# Patient Record
Sex: Male | Born: 1950 | Race: White | Hispanic: No | Marital: Married | Smoking: Never smoker
Health system: Southern US, Community
[De-identification: ages and names within clinical notes are randomized; demographics above are authoritative.]

## PROBLEM LIST (undated history)

## (undated) DIAGNOSIS — E781 Pure hyperglyceridemia: Secondary | ICD-10-CM

## (undated) DIAGNOSIS — K219 Gastro-esophageal reflux disease without esophagitis: Secondary | ICD-10-CM

## (undated) DIAGNOSIS — E291 Testicular hypofunction: Secondary | ICD-10-CM

## (undated) DIAGNOSIS — Z86010 Personal history of colonic polyps: Secondary | ICD-10-CM

## (undated) DIAGNOSIS — M5432 Sciatica, left side: Secondary | ICD-10-CM

## (undated) DIAGNOSIS — I639 Cerebral infarction, unspecified: Secondary | ICD-10-CM

## (undated) DIAGNOSIS — N183 Chronic kidney disease, stage 3 (moderate): Secondary | ICD-10-CM

## (undated) DIAGNOSIS — N400 Enlarged prostate without lower urinary tract symptoms: Secondary | ICD-10-CM

## (undated) DIAGNOSIS — M25552 Pain in left hip: Secondary | ICD-10-CM

## (undated) DIAGNOSIS — Z87438 Personal history of other diseases of male genital organs: Secondary | ICD-10-CM

## (undated) DIAGNOSIS — E1165 Type 2 diabetes mellitus with hyperglycemia: Secondary | ICD-10-CM

## (undated) DIAGNOSIS — I517 Cardiomegaly: Secondary | ICD-10-CM

## (undated) DIAGNOSIS — E782 Mixed hyperlipidemia: Secondary | ICD-10-CM

## (undated) DIAGNOSIS — IMO0002 Reserved for concepts with insufficient information to code with codable children: Secondary | ICD-10-CM

## (undated) DIAGNOSIS — I2510 Atherosclerotic heart disease of native coronary artery without angina pectoris: Secondary | ICD-10-CM

## (undated) DIAGNOSIS — E118 Type 2 diabetes mellitus with unspecified complications: Secondary | ICD-10-CM

## (undated) DIAGNOSIS — I10 Essential (primary) hypertension: Secondary | ICD-10-CM

## (undated) HISTORY — DX: Benign prostatic hyperplasia without lower urinary tract symptoms: N40.0

## (undated) HISTORY — DX: Personal history of other diseases of male genital organs: Z87.438

## (undated) HISTORY — DX: Type 2 diabetes mellitus with unspecified complications: E11.8

## (undated) HISTORY — DX: Pain in left hip: M25.552

## (undated) HISTORY — DX: Pure hyperglyceridemia: E78.1

## (undated) HISTORY — DX: Essential (primary) hypertension: I10

## (undated) HISTORY — DX: Reserved for concepts with insufficient information to code with codable children: IMO0002

## (undated) HISTORY — DX: Cardiomegaly: I51.7

## (undated) HISTORY — DX: Personal history of colonic polyps: Z86.010

## (undated) HISTORY — DX: Cerebral infarction, unspecified: I63.9

## (undated) HISTORY — DX: Mixed hyperlipidemia: E78.2

## (undated) HISTORY — DX: Type 2 diabetes mellitus with hyperglycemia: E11.65

## (undated) HISTORY — DX: Atherosclerotic heart disease of native coronary artery without angina pectoris: I25.10

## (undated) HISTORY — DX: Gastro-esophageal reflux disease without esophagitis: K21.9

## (undated) HISTORY — DX: Sciatica, left side: M54.32

## (undated) HISTORY — DX: Chronic kidney disease, stage 3 (moderate): N18.3

## (undated) HISTORY — DX: Testicular hypofunction: E29.1

## (undated) HISTORY — PX: PROSTATE SURGERY: SHX751

---

## 1999-05-01 ENCOUNTER — Encounter: Admission: RE | Admit: 1999-05-01 | Discharge: 1999-05-01 | Payer: Self-pay | Admitting: Gastroenterology

## 1999-05-01 ENCOUNTER — Encounter: Payer: Self-pay | Admitting: Gastroenterology

## 1999-10-04 ENCOUNTER — Encounter: Payer: Self-pay | Admitting: Orthopedic Surgery

## 1999-10-04 ENCOUNTER — Ambulatory Visit (HOSPITAL_COMMUNITY): Admission: RE | Admit: 1999-10-04 | Discharge: 1999-10-04 | Payer: Self-pay | Admitting: Orthopedic Surgery

## 2002-07-14 ENCOUNTER — Ambulatory Visit (HOSPITAL_COMMUNITY): Admission: RE | Admit: 2002-07-14 | Discharge: 2002-07-14 | Payer: Self-pay | Admitting: Gastroenterology

## 2003-04-27 HISTORY — PX: CORONARY ARTERY BYPASS GRAFT: SHX141

## 2004-04-29 ENCOUNTER — Emergency Department (HOSPITAL_COMMUNITY): Admission: EM | Admit: 2004-04-29 | Discharge: 2004-04-29 | Payer: Self-pay | Admitting: Emergency Medicine

## 2006-02-26 HISTORY — PX: TRANSTHORACIC ECHOCARDIOGRAM: SHX275

## 2006-04-19 ENCOUNTER — Inpatient Hospital Stay (HOSPITAL_BASED_OUTPATIENT_CLINIC_OR_DEPARTMENT_OTHER): Admission: RE | Admit: 2006-04-19 | Discharge: 2006-04-19 | Payer: Self-pay | Admitting: Cardiology

## 2006-04-19 HISTORY — PX: CARDIAC CATHETERIZATION: SHX172

## 2006-04-25 ENCOUNTER — Ambulatory Visit: Payer: Self-pay | Admitting: Cardiothoracic Surgery

## 2006-04-30 ENCOUNTER — Inpatient Hospital Stay (HOSPITAL_COMMUNITY): Admission: RE | Admit: 2006-04-30 | Discharge: 2006-05-06 | Payer: Self-pay | Admitting: Cardiothoracic Surgery

## 2006-04-30 ENCOUNTER — Ambulatory Visit: Payer: Self-pay | Admitting: Cardiothoracic Surgery

## 2006-05-21 ENCOUNTER — Ambulatory Visit: Payer: Self-pay | Admitting: Cardiothoracic Surgery

## 2006-05-23 ENCOUNTER — Encounter (HOSPITAL_COMMUNITY): Admission: RE | Admit: 2006-05-23 | Discharge: 2006-08-21 | Payer: Self-pay | Admitting: Cardiology

## 2006-08-22 ENCOUNTER — Encounter (HOSPITAL_COMMUNITY): Admission: RE | Admit: 2006-08-22 | Discharge: 2006-09-26 | Payer: Self-pay | Admitting: Cardiology

## 2007-04-07 ENCOUNTER — Encounter: Admission: RE | Admit: 2007-04-07 | Discharge: 2007-04-07 | Payer: Self-pay | Admitting: Orthopedic Surgery

## 2008-05-20 ENCOUNTER — Encounter: Admission: RE | Admit: 2008-05-20 | Discharge: 2008-05-20 | Payer: Self-pay | Admitting: Orthopedic Surgery

## 2008-05-28 ENCOUNTER — Encounter: Admission: RE | Admit: 2008-05-28 | Discharge: 2008-05-28 | Payer: Self-pay | Admitting: Orthopedic Surgery

## 2008-06-23 ENCOUNTER — Ambulatory Visit: Payer: Self-pay | Admitting: Cardiothoracic Surgery

## 2009-02-03 ENCOUNTER — Encounter
Admission: RE | Admit: 2009-02-03 | Discharge: 2009-02-03 | Payer: Self-pay | Admitting: Physical Medicine and Rehabilitation

## 2009-06-07 ENCOUNTER — Encounter: Admission: RE | Admit: 2009-06-07 | Discharge: 2009-06-07 | Payer: Self-pay | Admitting: Specialist

## 2009-06-26 HISTORY — PX: LAMINECTOMY: SHX219

## 2009-07-13 ENCOUNTER — Ambulatory Visit (HOSPITAL_COMMUNITY): Admission: RE | Admit: 2009-07-13 | Discharge: 2009-07-15 | Payer: Self-pay | Admitting: Specialist

## 2009-12-27 ENCOUNTER — Ambulatory Visit: Payer: Self-pay | Admitting: Cardiology

## 2010-02-26 DIAGNOSIS — I639 Cerebral infarction, unspecified: Secondary | ICD-10-CM

## 2010-02-26 DIAGNOSIS — M5432 Sciatica, left side: Secondary | ICD-10-CM

## 2010-02-26 DIAGNOSIS — M25552 Pain in left hip: Secondary | ICD-10-CM

## 2010-02-26 HISTORY — DX: Cerebral infarction, unspecified: I63.9

## 2010-02-26 HISTORY — DX: Sciatica, left side: M54.32

## 2010-02-26 HISTORY — DX: Pain in left hip: M25.552

## 2010-03-19 ENCOUNTER — Encounter: Payer: Self-pay | Admitting: Cardiology

## 2010-05-15 LAB — HEMOGLOBIN A1C: Hgb A1c MFr Bld: 9 % — ABNORMAL HIGH (ref <5.7)

## 2010-05-15 LAB — GLUCOSE, CAPILLARY
Glucose-Capillary: 120 mg/dL — ABNORMAL HIGH (ref 70–99)
Glucose-Capillary: 122 mg/dL — ABNORMAL HIGH (ref 70–99)
Glucose-Capillary: 131 mg/dL — ABNORMAL HIGH (ref 70–99)
Glucose-Capillary: 162 mg/dL — ABNORMAL HIGH (ref 70–99)
Glucose-Capillary: 183 mg/dL — ABNORMAL HIGH (ref 70–99)

## 2010-05-16 LAB — URINALYSIS, ROUTINE W REFLEX MICROSCOPIC
Glucose, UA: >1000 mg/dL — AB
Leukocytes, UA: NEGATIVE
Nitrite: NEGATIVE
Specific Gravity, Urine: 1.029 (ref 1.005–1.030)
pH: 6 (ref 5.0–8.0)

## 2010-05-16 LAB — COMPREHENSIVE METABOLIC PANEL
AST: 33 U/L (ref 0–37)
Albumin: 4.2 g/dL (ref 3.5–5.2)
Alkaline Phosphatase: 68 U/L (ref 39–117)
BUN: 17 mg/dL (ref 6–23)
Creatinine, Ser: 1.03 mg/dL (ref 0.4–1.5)
GFR calc Af Amer: >60 mL/min (ref >60)
Potassium: 3.8 mEq/L (ref 3.5–5.1)
Total Protein: 7.7 g/dL (ref 6.0–8.3)

## 2010-05-16 LAB — PROTIME-INR: INR: 0.98 (ref 0.00–1.49)

## 2010-05-16 LAB — CBC
HCT: 42.8 % (ref 39.0–52.0)
Platelets: 166 10*3/uL (ref 150–400)
RDW: 13 % (ref 11.5–15.5)

## 2010-05-16 LAB — APTT: aPTT: 29 seconds (ref 24–37)

## 2010-05-16 LAB — URINE MICROSCOPIC-ADD ON

## 2010-06-27 ENCOUNTER — Other Ambulatory Visit: Payer: Self-pay | Admitting: *Deleted

## 2010-06-27 DIAGNOSIS — E7800 Pure hypercholesterolemia, unspecified: Secondary | ICD-10-CM

## 2010-06-30 ENCOUNTER — Other Ambulatory Visit: Payer: Self-pay | Admitting: Cardiology

## 2010-06-30 MED ORDER — METOPROLOL TARTRATE 50 MG PO TABS: 50.0000 mg | ORAL_TABLET | Freq: Two times a day (BID) | ORAL | Status: DC

## 2010-06-30 NOTE — Telephone Encounter (Signed)
escribe medication per fax request  

## 2010-07-14 NOTE — Consult Note (Signed)
Tommy Morse, DEMASI NO.:  [id number]   MEDICAL RECORD NO.:  [id number]          PATIENT TYPE:  INP   LOCATION:  2031                         FACILITY:  [facility]   PHYSICIAN:  Sheliah Plane, MD    DATE OF BIRTH:  04/26/1950   DATE OF CONSULTATION:  05/21/2006  DATE OF DISCHARGE:  05/06/2006                                 CONSULTATION   OFFICE:  Seen in [place] office.   Patient was seen today in the office in followup after his recent  coronary artery bypass grafting, done on April 30, 2006.  At that time,  he had coronary artery bypass grafting x6 with the left internal mammary  artery to the LAD, reverse saphenous vein graft to the diagonal,  sequential reverse saphenous vein graft to the second and third obtuse  marginal, sequential reverse saphenous vein graft to the posterior  descending and continuation branch of the right coronary artery on April 30, 2006.  The patient has been doing well since discharge, increasing  his physical activity.  Appropriately, he has had no angina or evidence  of congestive heart failure.  He has had dysesthesias over the left  anterior chest associated with the use of the mammary artery.  He denies  any pedal edema.  Chest x-ray was done in Dr. Ronnald Nian office, raised a  question of some cardiac silhouette enlargement, and electrocardiogram  was done in followup to make sure there was no evidence of pleural  effusion.  The patient had moderate LVH with diastolic dysfunction, mild  mitral regurgitation, mild aortic sclerosis and global systolic function  was normal.  No significant pericardial effusion was appreciated.   PHYSICAL EXAMINATION:  VITAL SIGNS:  Patient's blood pressure was  110/70, pulse 68 and regular, respiratory rate is 18.  GENERAL:  Overall, patient looks and feels well.  LUNGS:  Clear bilaterally.  His sternum is stable and well healed.  His  right endo-veinn harvest sites are also healing well.  He has  no pedal  edema.   FOLLOWUP:  Chest x-ray done in Dr. Ronnald Nian office showed no pleural  effusion, question of borderline increasing cardiac silhouette.   MEDICATIONS:  The patient continues on fish oil, aspirin, Toprol, Altace  10 mg, and amiodarone 200 mg p.o. b.i.d. for brief atrial fibrillation  postoperatively.   I reviewed with him that he could return to driving, starting the  cardiac rehab program.  He should avoid any lifting for 25 pounds for  two to three months.  He plans to return to part-time work approximately  6 weeks post-op and gradually increase to full duties in 8 weeks post-  op.  He is to see Dr. Deborah Chalk in the near future.  In the past, the  patient has been intolerant of Statins.  In addition, I discussed with  him probably some time in next month stopping his amiodarone.  The  patient has had urinary tract issues in the past.  At the time of  surgery, he had some difficulty  in passing a Foley, because of urethral strictures.  A small size Foley  was passed.  The patient does continue to have urinary frequency.  I  have encouraged him to seek a follow-up visit with his established  urologist, to ensure that his bladder is emptying properly.      Sheliah Plane, MD  Electronically Signed     EG/MEDQ  D:  05/21/2006  T:  05/21/2006  Job:  [id number]   cc:   Colleen Can. Deborah Chalk, M.D.  Felipa Evener, M.D.

## 2010-07-14 NOTE — Op Note (Signed)
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   OPERATION PERFORMED:  Coronary artery bypass grafting times six with the  left internal mammary artery to left anterior descending coronary  artery, reversed saphenous vein graft to the diagonal coronary artery,  sequential reversed saphenous vein to obtuse marginal 2 and 3,  sequential reversed saphenous vein graft to the posterior descending and  continuation branch of the right coronary artery.   SURGEON:  Gwenith Daily. Tyrone Sage, M.D.   ASSISTANT:  Constance Holster, PA   ANESTHESIA:   INDICATIONS FOR PROCEDURE:  The patient is a 60 year old male with  strong family history of coronary disease who presented with increasing  symptoms of shortness of breath with exertion, underwent echo stress  test and was found to have significant ventricular hypertrophy and  exercise results consistent with coronary occlusive disease.  Coronary  artery angiography was performed by Colleen Can. Deborah Chalk, M.D., which  demonstrated severe three-vessel coronary artery disease with 70 to 80%  stenosis of the LAD and diagonal, 80% proximal circumflex with  sequential lesions involving two obtuse marginal branches, sequential 70  and 80 to 90% stenoses of the right coronary artery with subtotal  occlusion of the continuation branch of the right coronary artery and  high take off of the posterior descending.  Overall ventricular function  was preserved.  Because of the patient's  significant three-vessel  disease, coronary artery bypass grafting was recommended.  The patient  agreed and signed informed consent.   DESCRIPTION OF PROCEDURE:  With Swann-Ganz and arterial line monitors in  place, the patient underwent general endotracheal anesthesia without  incident.  The skin of the chest and legs was prepped with Betadine and  draped in the usual sterile manner.  Using the [employer] Vein  harvesting system, vein was harvested from the right thigh and calf and  was of excellent quality and caliber.  A median sternotomy was  performed.  The left internal mammary artery was dissected down as a  pedicle graft.  The distal artery was divided and had good free flow.  The pericardium was opened.  Overall ventricular function appeared  preserved.  The patient was systemically heparinized.  The ascending  aorta and the right atrium were cannulated in the aortic root.  A bent  cardioplegia needle was introduced into the ascending aorta.  The  patient was placed on cardiopulmonary bypass at 2.4L per minute per  meter squared.  Sites for anastomosis were selected and dissected out of  the epicardium.  The patient's body temperature was cooled to 30  degrees.  An aortic crossclamp was applied.  500 mL of cold blood  potassium cardioplegia was administered with rapid diastolic arrest of  the heart.  Myocardial septal temperature was monitored throughout the  crossclamp period.   Attention was turned first to the two obtuse marginal branches, each of  which was opened, was diffusely diseased but admitted a 1.5 mm probe  distally.  Using a diamond type side-to-side anastomosis was carried out  with second obtuse marginal.  The distal extend of the same vein was  then carried a short distance to the third obtuse marginal and a similar  fashion, a distal anastomosis was performed.  Attention was then turned  to the posterior descending coronary artery which arose off the  right  coronary artery more proximal than usual.  The continuation branch of  the right coronary artery was identified and was diffusely diseased but  did admit a 1 mm probe.  A diamond type side-to-side anastomosis was  carried out with the posterior descending coronary artery and the distal  extent of the same vein was then carried to the continuation branch of  the right coronary artery which was opened.  Each anastomosis was  carried out with 7-0 Prolenes.  Attention was then turned to the left  anterior descending coronary artery which was relatively small vessel,  not much larger than the diagonal.  Using the running 8-0 Prolene, the  left internal mammary artery was anastomosed to the left anterior  descending coronary artery.  The bulldog was removed from the mammary  artery and there was rise in myocardial septal temperature.  The aortic  crossclamp was removed.  The patient spontaneously converted to a sinus  rhythm, rewarmed to 37 degrees.  He was then ventilated and weaned from  cardiopulmonary bypass ion milrinone infusion.  He was decannulated in  the usual fashion.  Protamine sulfate was administered.  With the  operative field hemostatic, two atrial and two ventricular pacing wires  were applied.  Graft markers were applied.  A left pleural tube and a  __________ mediastinal drain were left in place.  Sternum was closed  with #6 stainless steel wire.  Fascia closed with interrupted 0 Vicryl,  running 3-0 Vicryl in the subcutaneous tissues and 4-0 subcuticular  stitch in the skin edges.  Dry dressings were applied.  Sponge and  needle counts were reported as correct at the completion of the  procedure.  The patient tolerated the procedure without obvious  complication and was transferred to the surgical intensive care unit for  further postoperative care.      Sheliah Plane, MD  Electronically Signed    EG/MEDQ  D:  05/03/2006  T:  05/03/2006  Job:  [id number]   cc:    Colleen Can. Deborah Chalk, M.D.

## 2010-07-14 NOTE — Discharge Summary (Signed)
Tommy Morse, Tommy Morse NO.:  [id number]   MEDICAL RECORD NO.:  [id number]          PATIENT TYPE:  INP   LOCATION:  2031                         FACILITY:  [facility]   PHYSICIAN:  Sheliah Plane, MD    DATE OF BIRTH:  07-02-1950   DATE OF ADMISSION:  04/30/2006  DATE OF DISCHARGE:  05/06/2006                               DISCHARGE SUMMARY   The patient is a 60 year old male referred to Dr. Tyrone Sage for possible  coronary artery bypass grafting.   HISTORY OF PRESENT ILLNESS:  The patient is a 60 year old male with a  strong family history of coronary occlusive disease who over the past  three months had noticed increasing fatigue and shortness of breath with  exertion, especially when climbing stairs.  He denied any paroxysmal  nocturnal dyspnea or edema.  He denied chest pain.  He had no previous  history of myocardial infarction.  A stress echocardiogram was done and  this was positive for ischemia.  The echocardiogram also showed  associated marked left ventricular hypertrophy.  The patient then  underwent cardiac catheterization by Dr. Deborah Chalk on April 19, 2006.  This study revealed a 20% distal narrowing of the left main.  There was  a 30 to 50% ostial narrowing of the circumflex.  There is a 90% stenosis  in the main portion of the circumflex just prior to the bifurcating  marginal vessels each also having 70 to 80% lesions.  The LAD was also  found to have an ostial narrowing.  In addition, there was a 60% mid-  vessel narrowing and an 80 to 90% distal vessel narrowing.  The right  coronary artery had a 95% diffuse narrowing in the proximal portion.  There were noted to be left-to-right collaterals present with an  ejection fraction of 60 to 65%.  Because of the diffuse nature of  disease, coronary artery bypass grafting was recommended to the patient  and the patient was admitted this hospitalization for the procedure.   PAST MEDICAL HISTORY:  Includes  hypertension and hyperlipidemia which he  has been treated with various satins.  HE IS UNABLE TO TOLERATE THESE.  He denied diabetes, denied smoking, denied previous stroke, denied  claudication, denied renal insufficiency.  His father had a myocardial  infarction and died at age 81.  Previous to that, his father had had  bypass surgery.  The patient's mother also had coronary artery bypass  grafting as well as critical aortic stenosis.   PAST SURGICAL HISTORY:  Arthroscopy in the left knee, he has also  undergone a previous urological procedure for benign prostatic  hyperplasia.   MEDICATIONS PRIOR TO ADMISSION:  Included:  1. Aspirin 81 mg daily.  2. Altace 10 mg daily.  3. Toprol 100 mg daily.  4. Fish oil capsules four a day.   ALLERGIES:  INCLUDES STATINS AS WELL AS AUGMENTIN.   REVIEW OF SYMPTOMS AND PHYSICAL EXAM:  Please see the history and  physical done at the time of admission.   HOSPITAL COURSE:  The patient was admitted electively and on April 30, 2006, he was  taken to the operating room at which time he underwent the  following procedure:  Coronary artery bypass grafting x6 with the left  internal mammary artery to the left anterior descending coronary artery,  reversed saphenous vein graft to the diagonal coronary artery,  sequential reversed saphenous vein to the obtuse marginal 2 and 3, and  sequential reversed saphenous vein graft to the posterior descending and  continuation branch of the right coronary artery.  Patient tolerated the  procedure well and was taken to the surgical intensive care unit in  stable condition.   POSTOPERATIVE HOSPITAL COURSE:  Patient has done well.  He was extubated  without difficulty.  He has maintained stable hemodynamics.  He  initially did require some inotropic support, this was weaned without  difficulty.  He did have postoperative atrial fibrillation and was  chemically cardioverted with amiodarone back to a normal sinus  rhythm.  He has been quite anxious in the postoperative period, but this is  slowly improving.  His cardiac enzymes were checked postoperatively and  they were unremarkable.  His laboratory values also reveal a moderate  anemia, but his hemoglobin and hematocrit have stabilized.  It was most  recent 10 and 31, respectively, on May 04, 2006.  Patient has had an  elevated glucose in the postoperative period.  Currently, a hemoglobin  A1c is pending.  It is Dr. York Spaniel recommendation that the patient be  assessed as an outpatient for his diabetes and he has not been started  on any oral medication at this point.  His blood sugars are under  reasonable control, but will definitely need to be addressed as an  outpatient.  Currently, the patient is felt to be stable for tentative  discharge in the morning of May 06, 2006 pending morning round  reevaluation.   MEDICATIONS AT TIME OF DISCHARGE:  Will be as follows:  1. Aspirin 325 mg daily.  2. Lopressor 50 mg twice daily.  3. Altace 10 mg daily.  4. Lasix 40 mg daily for five days.  5. Potassium 20 mEq daily for five days.  6. Mucinex 600 mg twice daily as needed.  7. Claritin and Sudafed over-the-counter as needed.  8. Amiodarone 400 mg twice daily x7 days and then once daily for pain.  9. Ultram 50 mg one to two tablets every six hours as needed.   INSTRUCTIONS:  The patient will receive written instructions in regard  to medications, activity, diet, wound care and followup.  Followup  include Dr. Deborah Chalk in two weeks, he is instructed to call for an  appointment.  Additionally, he will see Dr. Tyrone Sage in three weeks, the  office will arrange this.   FINAL DIAGNOSIS:  Severe coronary artery disease as outlined above.   OTHER DIAGNOSES:  Include:  1. Hypertension.  2. Hyperlipidemia.  3. INTOLERANCE TO STATINS.  4. Significant family history of coronary artery disease.  5. Postoperative anemia. 6. Postoperative atrial  fibrillation.  7. History of benign prostatic hyperplasia.      Rowe Clack, P.A.-C.      Sheliah Plane, MD  Electronically Signed    WEG/MEDQ  D:  05/05/2006  T:  05/05/2006  Job:  [id number]   cc:   Avelino Leeds

## 2010-07-14 NOTE — Op Note (Signed)
   NAME:  Tommy Morse, Tommy Morse                        ACCOUNT NO.:  [id number]   MEDICAL RECORD NO.:  [id number]                   PATIENT TYPE:  AMB   LOCATION:  ENDO                                 [facility]ACILITY[facility]  [facility]   PHYSICIAN:  John C. Madilyn Fireman, M.D.                 DATE OF BIRTH:  1950-09-17   DATE OF PROCEDURE:  07/14/2002  DATE OF DISCHARGE:                                 OPERATIVE REPORT   PROCEDURE:  Colonoscopy.   INDICATION FOR PROCEDURE:  Rectal bleeding in a 60 year old patient with no  prior colon screening.   DESCRIPTION OF PROCEDURE:  The patient was placed in the left lateral  decubitus position and placed on the pulse monitor with continuous low-flow  oxygen delivered by nasal cannula.  He was sedated with 125 mcg IV fentanyl  and 10 mg IV Versed.  The Olympus video colonoscope was inserted into the  rectum and advanced to the cecum, confirmed by transillumination of  McBurney's point and visualization of the ileocecal valve and appendiceal  orifice.  The prep was excellent.  The cecum, ascending, transverse,  descending, and sigmoid colon all appeared normal with no masses, polyps,  diverticula, or other mucosal abnormalities.  The rectum likewise appeared  normal, and retroflexed view of the anus revealed only small internal  hemorrhoids.  The colonoscope was then withdrawn and the patient returned to  the recovery room in stable condition.  He tolerated the procedure well, and  there were no immediate complications.   IMPRESSION:  Internal hemorrhoids, otherwise normal colonoscopy.   PLAN:  Repeat next colon screening by sigmoidoscopy in five years.                                               John C. Madilyn Fireman, M.D.    JCH/MEDQ  D:  07/14/2002  T:  07/15/2002  Job:  [id number]   cc:   Colleen Can. Deborah Chalk, M.D.  [address], [address]  [place]  [place] [address]  Fax: [phone]

## 2010-07-14 NOTE — Cardiovascular Report (Signed)
NAMETRADARIUS, REINWALD              ACCOUNT NO.:  [id number]   MEDICAL RECORD NO.:  [id number]          PATIENT TYPE:  OIB   LOCATION:  1961                         FACILITY:  [facility]   PHYSICIAN:  Colleen Can. Deborah Chalk, M.D.DATE OF BIRTH:  04-Apr-1950   DATE OF PROCEDURE:  DATE OF DISCHARGE:                            CARDIAC CATHETERIZATION   ADDENDUM:  Abdominal aortogram was performed which showed no  atherosclerosis and no renal artery stenosis.      Colleen Can. Deborah Chalk, M.D.  Electronically Signed     SNT/MEDQ  D:  04/19/2006  T:  04/20/2006  Job:  [id number]

## 2010-07-14 NOTE — Consult Note (Signed)
NAMEPARISH, DUBOSE NO.:  [id number]   MEDICAL RECORD NO.:  [id number]          PATIENT TYPE:  OIB   LOCATION:  1961                         FACILITY:  [facility]   PHYSICIAN:  Sheliah Plane, MD    DATE OF BIRTH:  October 28, 1950   DATE OF CONSULTATION:  04/25/2005  DATE OF DISCHARGE:  04/19/2006                                 CONSULTATION   PRIMARY CARE PHYSICIAN:  Avelino Leeds, M.D.   REASON FOR CONSULTATION:  Coronary occlusive disease. Referred for  possible coronary artery bypass grafting.   HISTORY OF PRESENT ILLNESS:  The patient is a 60 year old male with a  strong family history of coronary occlusive disease who, over the past 3  months, had noticed increasing fatigue and shortness of breath with  exertion, especially when climbing stairs. He denies any PND. Denies  pedal edema. Denies chest pain. Has had no previous history of  myocardial infarction. Because of the strong family history of coronary  disease, a stress echocardiogram was done and was positive for ischemia,  echocardiographically and electrocardiographically. The echocardiogram  also showed associated marked left ventricular hypertrophy. Because of  these reasons, he underwent cardiac catheterization by Dr. Deborah Chalk on  April 19, 2006. Cardiac risk factors included hypertension and  hyperlipidemia. The patient has had known hyperlipidemia and has been  treated with various statins, most recently treated with Tricor.  However, he has been unable to tolerate Statins. He denies diabetes,  denies smoking, denies previous stroke, denies claudication, denies  renal insufficiency. His father had a myocardial infarction and died at  age 55. Previously had had bypass surgery. The patient's mother died at  age 9 after having coronary artery bypass grafting several years before  and then being diagnosed with critical AS. The patient has 4 brothers  alive. One brother has died. He has 2 sisters.  All of his brothers have  had a history of hypercholesterolemia.   PAST SURGICAL HISTORY:  Had arthroscopy of the left knee. Has had  previous urologic procedure done for hypertrophy.   SOCIAL HISTORY:  The patient is married. Lives with his wife. Denies  alcohol or tobacco use. Has 3 grown children. Currently employed as a  Optician, dispensing.   MEDICATIONS:  Include aspirin 81 mg daily, Altace 10 mg daily, Toprol  100 mg daily, and fish oil 4 capsules a day.   ALLERGIES:  STATINS. History of swelling of his face with AUGMENTIN.   REVIEW OF SYSTEMS:  CARDIAC:  Negative for chest pain, resting shortness  of breath, orthopnea, syncope, pre-syncope, palpitations, lower  extremity edema. He does have exertional shortness of breath. GENERAL:  Denies constitutional symptoms other than his wife notes that he has had  increasing fatigue over the past 3 months. Denies any vision changes.  Denies amaurosis. Denies TIA's. Denies orthopnea. Does have urinary  frequency and urgency. Denies blood in stool or urine.   PHYSICAL EXAMINATION:  VITAL SIGNS:  Blood pressure 148/88, pulse 69,  respiratory rate 18. O2 sat 98%. Height 6 foot 1 inch tall. Weight is  213 pounds.  NEUROLOGIC: Alert. He is able to  relay history without difficulty.  HEENT:  Pupils are equal, round, and reactive to light and  accommodation.  NECK:  Without carotid bruits or jugular venous distention.  LUNGS:  Clear bilaterally.  CARDIAC:  Regular rate and rhythm. Without murmur or gallop.  ABDOMEN:  Examination benign without palpable masses or organomegaly.  The aorta is not palpably enlarged. He appears to have adequate vein for  bypass in both lower extremities. There are +1 and +2 DP and PT pulses  bilaterally. The patient is right handed and has negative Allen's test  bilaterally.   LABORATORY DATA:  Preoperative carotid Doppler's show normal upper  extremity Doppler's. No internal carotid artery stenosis. Cardiac   catheterization is performed by Dr. Deborah Chalk, which shows 20% distal  narrowing of the left main. There is a 30% to 50% ostial narrowing of  the circumflex, 90% stenosis in the main portion of the circumflex, just  prior to the bifurcating marginal vessels, each with 70% to 80%. Both,  just the marginal vessels appear suitable for bypass. The LAD has an  ostial narrowing and a 60% mid narrowing, 80% to 90% distally. The right  coronary artery has 95% diffuse narrowing in the proximal portion. There  are left to right collaterals present with ejection fraction of 60% to  65%. Because of the diffuse nature of the disease, coronary artery  bypass grafting is recommended to the patient, who agrees and signs  informed consent.   DESCRIPTION OF PROCEDURE:  The risks and options are discussed with the  patient in detail including the risk of death, infection, stroke,  myocardial infarction, bleeding, and blood transfusion. The patient is  willing to proceed and tentatively planned for Tuesday, April 30, 2006.      Sheliah Plane, MD  Electronically Signed     EG/MEDQ  D:  04/25/2006  T:  04/25/2006  Job:  [id number]   cc:   Cathlean Cower. Deborah Chalk, M.D.

## 2010-07-14 NOTE — Cardiovascular Report (Signed)
NAMEEESA, JUSTISS              ACCOUNT NO.:  [id number]   MEDICAL RECORD NO.:  [id number]          PATIENT TYPE:  OIB   LOCATION:  1961                         FACILITY:  [facility]   PHYSICIAN:  Colleen Can. Deborah Chalk, M.D.DATE OF BIRTH:  Apr 21, 1950   DATE OF PROCEDURE:  04/19/2006  DATE OF DISCHARGE:                            CARDIAC CATHETERIZATION   PROCEDURE:  Left-and-right heart catheterization with selective coronary  angiography, left ventricular angiography.   Type and site of interpercutaneous right femoral artery.  Catheter is a  4-French four curved Judkins right-and-left coronary catheters, and 4-  French pigtail ventriculographic catheter.   CONTRAST:  Omnipaque.   MEDICATIONS:  Given prior procedure--Valium 10 mg p.o.   MEDICATIONS GIVEN DURING PROCEDURE:  Versed 2 mg IV.   COMMENTS:  The patient tolerated the procedure well.   HEMODYNAMIC DATA:  The aortic pressure was 121/74, LV was 120 over 5-9.  There is no aortic valve gradient noted on pullback.   ANGIOGRAPHIC DATA:  The coronary arteries were calcified proximally.   FINDINGS:  1. Left Main Coronary Artery:  The left main coronary artery had a 20-      30% distal narrowing.  2. Left Circumflex:  The left circumflex continued mainly as a      bifurcating marginal vessel.  There is an ostial 30-50% narrowing.      In the early main portion of the circumflex, there is a 95%      stenosis with diffuse disease otherwise.  At the beginning of the      bifurcation of the marginal vessels, there is a 70-80% narrowing.      The distal marginal vessels would appear to be suitable for bypass      grafting.  3. Left Anterior Descending:  The left anterior descending had      approximately 30% ostial narrowing.  There was 50-60% narrowing in      the midportion of the left anterior descending.  There is a larger      second diagonal vessel.  There is 50% narrowing just after the      origin of the diagonal.  As the  left anterior descending crossed      the apex, there was an 80-90% stenosis very distally.  4. Right Coronary Artery:  The right coronary artery had a 95% diffuse      narrowing in its proximal portion.  It was a somewhat diffuse      section with released sequential lesions.  There was satisfactory      distal vessels for bypass, but there was also left-to-right      collaterals distally present.   LEFT VENTRICULAR ANGIOGRAM:  Performed in an RAO position.  Overall  cardiac size and silhouette were normal.  Global ejection fraction would  be in the 60-65% range.  Regional wall motion was normal.   OVERALL IMPRESSION:  Diffuse severe 3-vessel coronary disease with 95%  right coronary artery, 95% left circumflex, 60% left anterior  descending, but with ostial left circumflex, distal left main, and  ostial LAD disease limiting procedural options.   ADDENDUM:  There was a small high intermediate branch with a 90%  stenosis, but it is too small to consider for bypass grafting.   DISCUSSION:  In light of these findings, will review the scenarios, but  almost certainly will refer the patient for coronary artery bypass  grafting.      Colleen Can. Deborah Chalk, M.D.  Electronically Signed     SNT/MEDQ  D:  04/19/2006  T:  04/19/2006  Job:  [id number]

## 2010-07-14 NOTE — H&P (Signed)
NAMESTANISLAV, Tommy Morse              ACCOUNT NO.:  [id number]   MEDICAL RECORD NO.:  [id number]           PATIENT TYPE:   LOCATION:                                 FACILITY:   PHYSICIAN:  Colleen Can. Deborah Chalk, M.D.DATE OF BIRTH:  August 04, 1950   DATE OF ADMISSION:  04/19/2006  DATE OF DISCHARGE:                              HISTORY & PHYSICAL   CHIEF COMPLAINT:  None.   HISTORY OF PRESENT ILLNESS:  Mr. Scialdone is a very pleasant 60 year old  white male.  He has known hypertensive heart disease and history of  hyperlipidemia.  He has basically been statin intolerant secondary to  myalgias.  He has prominent LVH on echocardiogram.  He had a recent  stress echocardiogram, which was electrocardiographically positive for  ischemia as well as echocardiographically positive for ischemia but  associated with marked LV hypertrophy.  He is now referred for elective  cardiac catheterization.  He has had no complaints of chest pain.   PAST MEDICAL HISTORY.:  1. Hyperlipidemia.  He is intolerant to statins  2. Known LVH  3. Hypertensive heart disease.  4. Positive family history of coronary disease.  5. Remote history of headaches following a basketball injury.   ALLERGIES:  Allergies are AUGMENTIN AND STATINS.   CURRENT MEDICINES:  Include:  1. Aspirin 81 mg a day.  2. Altace 10 mg a day.  3. Toprol XL 25 mg a day.  4. Fish oil (Lovaza) 4 capsules daily.   FAMILY HISTORY:  His father died at 36 of a heart attack and had had  bypass surgery.  Mother died of 91 with heart attack and valvular heart  disease.  He is had one brother who is died secondary to alcohol and  drug abuse.  One brother has had hypertension as well as obesity.  His  uncles died in their 7s and early 58s of coronary disease and all of  them were cigarette smokers.   SOCIAL HISTORY:  He is a Optician, dispensing at [employer]  He does not use alcohol or tobacco.  He lives at home with his  wife and he has  three grown children.   REVIEW OF SYSTEMS:  Is as noted above is otherwise unremarkable.   ON EXAM:  He is a very pleasant middle-aged white male in no acute  distress.  VITAL SIGNS:  His weight is 213 pounds, blood pressure is 138/78  sitting, 130/70 standing, heart rate 72, respirations 18.  He  is  afebrile.  SKIN:  Warm and dry.  Color is unremarkable.  LUNGS:  Clear.  HEART:  Shows regular rhythm.  ABDOMEN:  Soft, positive bowel sounds, nontender.  EXTREMITIES:  Without edema.  NEUROLOGIC:  Shows no gross focal deficits.   Pertinent labs are pending.   OVERALL IMPRESSION:  1. Abnormal stress echocardiogram.  2. Hypertension with associated LVH  3. Strongly positive history of coronary disease.  4. Hyperlipidemia with statin intolerance.   PLAN:  Will proceed on with diagnostic catheterization.  Procedure has  been reviewed in full detail and he is willing to proceed on  Friday,  April 19, 2006.      Sharlee Blew, N.P.      Colleen Can. Deborah Chalk, M.D.  Electronically Signed    LC/MEDQ  D:  04/17/2006  T:  04/17/2006  Job:  [id number]   cc:   Avelino Leeds

## 2010-07-17 ENCOUNTER — Encounter: Payer: Self-pay | Admitting: *Deleted

## 2010-07-17 DIAGNOSIS — I2510 Atherosclerotic heart disease of native coronary artery without angina pectoris: Secondary | ICD-10-CM

## 2010-07-17 DIAGNOSIS — I517 Cardiomegaly: Secondary | ICD-10-CM

## 2010-07-17 DIAGNOSIS — I10 Essential (primary) hypertension: Secondary | ICD-10-CM

## 2010-07-17 DIAGNOSIS — E785 Hyperlipidemia, unspecified: Secondary | ICD-10-CM

## 2010-07-18 ENCOUNTER — Ambulatory Visit (INDEPENDENT_AMBULATORY_CARE_PROVIDER_SITE_OTHER): Payer: BC Managed Care – PPO | Admitting: Cardiology

## 2010-07-18 ENCOUNTER — Other Ambulatory Visit (INDEPENDENT_AMBULATORY_CARE_PROVIDER_SITE_OTHER): Payer: BC Managed Care – PPO | Admitting: Cardiology

## 2010-07-18 ENCOUNTER — Encounter: Payer: Self-pay | Admitting: *Deleted

## 2010-07-18 ENCOUNTER — Other Ambulatory Visit (INDEPENDENT_AMBULATORY_CARE_PROVIDER_SITE_OTHER): Payer: BC Managed Care – PPO | Admitting: *Deleted

## 2010-07-18 ENCOUNTER — Encounter: Payer: Self-pay | Admitting: Cardiology

## 2010-07-18 DIAGNOSIS — I2510 Atherosclerotic heart disease of native coronary artery without angina pectoris: Secondary | ICD-10-CM | POA: Insufficient documentation

## 2010-07-18 DIAGNOSIS — I10 Essential (primary) hypertension: Secondary | ICD-10-CM

## 2010-07-18 DIAGNOSIS — E7800 Pure hypercholesterolemia, unspecified: Secondary | ICD-10-CM

## 2010-07-18 DIAGNOSIS — I4891 Unspecified atrial fibrillation: Secondary | ICD-10-CM

## 2010-07-18 DIAGNOSIS — E785 Hyperlipidemia, unspecified: Secondary | ICD-10-CM

## 2010-07-18 LAB — BASIC METABOLIC PANEL
Chloride: 101 mEq/L (ref 96–112)
Potassium: 3.9 mEq/L (ref 3.5–5.1)

## 2010-07-18 LAB — HEPATIC FUNCTION PANEL: Albumin: 4.1 g/dL (ref 3.5–5.2)

## 2010-07-18 LAB — LIPID PANEL
Cholesterol: 306 mg/dL — ABNORMAL HIGH (ref 0–200)
Total CHOL/HDL Ratio: 7
Triglycerides: 770 mg/dL — ABNORMAL HIGH (ref 0.0–149.0)
VLDL: 154 mg/dL — ABNORMAL HIGH (ref 0.0–40.0)

## 2010-07-18 LAB — LDL CHOLESTEROL, DIRECT: Direct LDL: 154.3 mg/dL

## 2010-07-18 NOTE — Assessment & Plan Note (Signed)
Blood pressure is well controlled on current medications. 

## 2010-07-18 NOTE — Assessment & Plan Note (Signed)
Is currently free of symptoms and had a negative stress Cardiolite study in April of 2011 before his back surgery. His ejection fraction was 64%. The main concern is his inability to take cholesterol lowering medications.

## 2010-07-18 NOTE — Progress Notes (Signed)
Subjective:   Tommy Morse is seen today for a followup visit to in general, he is recovering nicely from his spinal stenosis surgery one year ago. He a coronary artery bypass graft in March 2008 x6 with a left internal mammary artery graft to the LAD, saphenous vein graft to diagonal, saphenous vein graft to second and third obtuse marginals, and a saphenous vein graft to posterior descending and continuation branch of the right coronary artery.  He has hyperlipemia and has been unable to tolerate cholesterol lowering medicines. He's been to lipid clinic in [place] without benefit. His family physician, Dr. Rondall Allegra has also tried numerous meds to lower his cholesterol level and we are left with exercise and diet primarily. Tommy Morse has mild elevation of glucose levels which are generally well-controlled. He has a history of hypertension that is well-controlled.  Current Outpatient Prescriptions  Medication Sig Dispense Refill  . aspirin 325 MG tablet Take 325 mg by mouth daily.        Marland Kitchen glipiZIDE (GLUCOTROL) 5 MG tablet Take 5 mg by mouth 2 (two) times daily before a meal.        . hydrochlorothiazide 25 MG tablet Take 25 mg by mouth daily.        . metFORMIN (GLUCOPHAGE) 500 MG tablet Take 1,000 mg by mouth 2 (two) times daily with a meal.       . metoprolol (LOPRESSOR) 50 MG tablet Take 1 tablet (50 mg total) by mouth 2 (two) times daily.  60 tablet  11  . omega-3 acid ethyl esters (LOVAZA) 1 G capsule Take 2 g by mouth daily.        . ramipril (ALTACE) 10 MG tablet Take 10 mg by mouth daily.          Allergies  Allergen Reactions  . Augmentin   . Statins     Patient Active Problem List  Diagnoses  . Hypertension  . Hyperlipidemia  . LVH (left ventricular hypertrophy)  . Coronary artery disease  . Diabetes mellitus    History  Smoking status  . Never Smoker   Smokeless tobacco  . Never Used    History  Alcohol Use No    Family History  Problem Relation Age of Onset  . Heart  disease Mother     valvular heart disease / Strong Family History  . Heart attack Father   . Heart attack Mother   . Hypertension Brother     Review of Systems:   The patient denies any heat or cold intolerance.  No weight gain or weight loss.  The patient denies headaches or blurry vision.  There is no cough or sputum production.  The patient denies dizziness.  There is no hematuria or hematochezia.  The patient denies any muscle aches or arthritis.  The patient denies any rash.  The patient denies frequent falling or instability.  There is no history of depression or anxiety.  All other systems were reviewed and are negative.   Physical Exam:   Weight is 211. Blood pressure is 130/90. Heart rate is 60.The head is normocephalic and atraumatic.  Pupils are equally round and reactive to light.  Sclerae nonicteric.  Conjunctiva is clear.  Oropharynx is unremarkable.  There's adequate oral airway.  Neck is supple there are no masses.  Thyroid is not enlarged.  There is no lymphadenopathy.  Lungs are clear.  Chest is symmetric.  Heart shows a regular rate and rhythm.  S1 and S2 are normal.  There is  no murmur click or gallop.  Abdomen is soft normal bowel sounds.  There is no organomegaly.  Genital and rectal deferred.  Extremities are without edema.  Peripheral pulses are adequate.  Neurologically intact.  Full range of motion.  The patient is not depressed.  Skin is warm and dry. Assessment / Plan:

## 2010-07-18 NOTE — Assessment & Plan Note (Signed)
He is unable to take statin therapy and is trying to modify his diet.

## 2010-07-19 ENCOUNTER — Other Ambulatory Visit: Payer: Self-pay | Admitting: Cardiology

## 2010-07-20 ENCOUNTER — Other Ambulatory Visit: Payer: Self-pay | Admitting: *Deleted

## 2010-07-20 MED ORDER — METFORMIN HCL 500 MG PO TABS: 1000.0000 mg | ORAL_TABLET | Freq: Two times a day (BID) | ORAL | Status: DC

## 2010-07-20 NOTE — Telephone Encounter (Signed)
[facility]

## 2010-07-26 ENCOUNTER — Telehealth: Payer: Self-pay | Admitting: *Deleted

## 2010-07-26 NOTE — Telephone Encounter (Signed)
Returned phone call about blood work results. Please call back.

## 2010-07-26 NOTE — Telephone Encounter (Signed)
Pt notified of lab results and will continue to work on diet and exercise.

## 2010-07-26 NOTE — Telephone Encounter (Signed)
LMOM to call back regarding lab work 

## 2010-08-22 ENCOUNTER — Other Ambulatory Visit: Payer: Self-pay | Admitting: *Deleted

## 2010-08-22 NOTE — Telephone Encounter (Signed)
RN called in refill for Lopressor 50 mg 1 1/2 tablet po bid #90 with 6 refills to [facility] per pt's request.

## 2010-11-14 ENCOUNTER — Inpatient Hospital Stay (HOSPITAL_COMMUNITY)
Admission: AD | Admit: 2010-11-14 | Discharge: 2010-11-15 | DRG: 014 | Disposition: A | Discharge status: Home / Self Care | Payer: BC Managed Care – PPO | Source: Other Acute Inpatient Hospital | Attending: Internal Medicine | Admitting: Internal Medicine

## 2010-11-14 DIAGNOSIS — Z7902 Long term (current) use of antithrombotics/antiplatelets: Secondary | ICD-10-CM

## 2010-11-14 DIAGNOSIS — I10 Essential (primary) hypertension: Secondary | ICD-10-CM | POA: Diagnosis present

## 2010-11-14 DIAGNOSIS — I2510 Atherosclerotic heart disease of native coronary artery without angina pectoris: Secondary | ICD-10-CM | POA: Diagnosis present

## 2010-11-14 DIAGNOSIS — E785 Hyperlipidemia, unspecified: Secondary | ICD-10-CM | POA: Diagnosis present

## 2010-11-14 DIAGNOSIS — E781 Pure hyperglyceridemia: Secondary | ICD-10-CM | POA: Diagnosis present

## 2010-11-14 DIAGNOSIS — Z951 Presence of aortocoronary bypass graft: Secondary | ICD-10-CM

## 2010-11-14 DIAGNOSIS — Z7982 Long term (current) use of aspirin: Secondary | ICD-10-CM

## 2010-11-14 DIAGNOSIS — E119 Type 2 diabetes mellitus without complications: Secondary | ICD-10-CM | POA: Diagnosis present

## 2010-11-14 DIAGNOSIS — I6350 Cerebral infarction due to unspecified occlusion or stenosis of unspecified cerebral artery: Principal | ICD-10-CM | POA: Diagnosis present

## 2010-11-14 DIAGNOSIS — Z79899 Other long term (current) drug therapy: Secondary | ICD-10-CM

## 2010-11-14 LAB — CBC
MCH: 31.2 pg (ref 26.0–34.0)
Platelets: 173 10*3/uL (ref 150–400)
RBC: 4.62 MIL/uL (ref 4.22–5.81)
WBC: 6.6 10*3/uL (ref 4.0–10.5)

## 2010-11-14 LAB — URINALYSIS, ROUTINE W REFLEX MICROSCOPIC
Leukocytes, UA: NEGATIVE
Nitrite: NEGATIVE
Specific Gravity, Urine: 1.013 (ref 1.005–1.030)
Urobilinogen, UA: 0.2 mg/dL (ref 0.0–1.0)

## 2010-11-15 ENCOUNTER — Encounter: Payer: Self-pay | Admitting: Neurology

## 2010-11-15 ENCOUNTER — Inpatient Hospital Stay (HOSPITAL_COMMUNITY): Payer: BC Managed Care – PPO

## 2010-11-15 DIAGNOSIS — I059 Rheumatic mitral valve disease, unspecified: Secondary | ICD-10-CM

## 2010-11-15 DIAGNOSIS — R0989 Other specified symptoms and signs involving the circulatory and respiratory systems: Secondary | ICD-10-CM

## 2010-11-15 LAB — LIPID PANEL
LDL Cholesterol: UNDETERMINED mg/dL (ref 0–99)
VLDL: UNDETERMINED mg/dL (ref 0–40)

## 2010-11-15 LAB — CARDIAC PANEL(CRET KIN+CKTOT+MB+TROPI)
Relative Index: 0.8 (ref 0.0–2.5)
Total CK: 398 U/L — ABNORMAL HIGH (ref 7–232)
Troponin I: <0.3 ng/mL (ref <0.30)

## 2010-11-15 LAB — COMPREHENSIVE METABOLIC PANEL
ALT: 27 U/L (ref 0–53)
Calcium: 9.9 mg/dL (ref 8.4–10.5)
GFR calc Af Amer: >60 mL/min (ref >60)
Glucose, Bld: 167 mg/dL — ABNORMAL HIGH (ref 70–99)
Sodium: 138 mEq/L (ref 135–145)
Total Protein: 7.2 g/dL (ref 6.0–8.3)

## 2010-11-15 LAB — GLUCOSE, CAPILLARY
Glucose-Capillary: 144 mg/dL — ABNORMAL HIGH (ref 70–99)
Glucose-Capillary: 146 mg/dL — ABNORMAL HIGH (ref 70–99)

## 2010-11-15 LAB — APTT: aPTT: 31 seconds (ref 24–37)

## 2010-11-15 LAB — HEMOGLOBIN A1C: Hgb A1c MFr Bld: 6.4 % — ABNORMAL HIGH (ref <5.7)

## 2010-11-15 LAB — PROTIME-INR: Prothrombin Time: 12.7 seconds (ref 11.6–15.2)

## 2010-11-15 NOTE — Discharge Summary (Signed)
Tommy Morse, Tommy Morse NO.:  [id number]  MEDICAL RECORD NO.:  [id number]  LOCATION:  3005                         FACILITY:  [facility]  PHYSICIAN:  Tommy Blower, MD       DATE OF BIRTH:  16-Jul-1950  DATE OF ADMISSION:  11/14/2010 DATE OF DISCHARGE:                              DISCHARGE SUMMARY   PRIMARY CARE PHYSICIAN:  Dr. Avelino Morse in [facility].  PRIMARY CARDIOLOGIST:  Was Tommy Morse. Tommy Chalk, MD, is attempting to see a new cardiologist in [place] office as Dr. Deborah Morse is in the process of retiring.  NEUROLOGISTS:  During the course of the hospital stay, Tommy Novas, MD and Tommy P. Pearlean Brownie, MD  DISCHARGE DIAGNOSES: 1. 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 2. Hypertriglyceridemia. 3. Hypertension. 4. Type 2 diabetes. 5. History of coronary artery disease.  DISCHARGE MEDICATIONS: 1. Plavix 75 mg p.o. daily. 2. Ramipril 10 mg p.o. daily. 3. Omega-3 fatty acids 4 g p.o. q.a.m. 4. Metoprolol 75 mg p.o. b.i.d. 5. Metformin 1000 mg p.o. b.i.d. 6. Glipizide 5 mg p.o. daily. 7. Hydrochlorothiazide 25 mg p.o. daily.  BRIEF ADMITTING HISTORY AND PHYSICAL:  [name] is a 60 year old Caucasian male who presented on November 14, 2010, with complaints of unsteady gait.  RADIOLOGY/IMAGING:  The patient had MRI, MRA of the brain and head without contrast which showed subtle right lateral medullary acute lacunar infarct.  No mass effect or hemorrhage.  No other acute intracranial abnormality and stable, mild for age nonspecific white matter changes.  MRA showed negative intracranial MRA.  Dominant right vertebral artery with patent right PICA.  The patient had a 2D echocardiogram on November 15, 2010, which showed left ventricle cavity size was normal.  Wall thickness was increased in the pattern of mild LVH.  Mild focal basal hypertrophy of the septum.  Systolic function was normal.  Ejection fraction was 50-55%.  Wall motion was  normal.  There were no regional wall motion abnormalities.  Pseudo-normal left ventricular filling pattern, consistent with possible grade 2 diastolic dysfunction.  Mild regurgitation.  LABS:  CBC shows a white count of 6.6, hemoglobin 14.4, hematocrit 40.8, platelet count 173.  Electrolytes normal except potassium of 3.2.  BUN is 19, creatinine 1.08.  Hemoglobin A1c 6.4.  Cholesterol is 281, triglyceride is 633.  UA is negative for nitrites and leukocytes.  HOSPITAL COURSE BY PROBLEM: 1. 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  The patient     was evaluated by Neurology and had above imaging.  The patient also     had carotid Dopplers which shows no evidence of ICA stenosis.  The     patient also had 2D echocardiogram with results as indicated above.     Given the patient had this event on aspirin, Neurology recommended     changing the patient's aspirin to Plavix. 2. Hypertriglyceridemia.  The patient reports an allergy to STATINs.     As a result, he was not started on statin even though had elevated     cholesterol and triglycerides.  He also reports that he has had     intolerance to other lipid-lowering agents including statin in the  past.  We will defer to the patient's cardiologist for further     management, uncertain if the patient may benefit from being on     CoQ10 prior to being on restarted on a trial of statins if his     cardiologist deems appropriate in the future. 3. Type 2 diabetes stable.  The patient will resume home medications     at the time of discharge. 4. History of coronary artery disease, stable.  Continued the patient     on home medications at the time of discharge.  DISPOSITION AND FOLLOWUP:  The patient is to follow up with Neurology in 1-2 weeks.  The patient is to follow up with Dr. Rosiland Morse his primary care physician in 1 week.  Time spent on discharge talking to the patient and coordinating care was 35 minutes.     Tommy Blower, MD     SR/MEDQ  D:  11/15/2010  T:  11/15/2010  Job:  [id number]  Electronically Signed by Tommy Morse  on 11/15/2010 08:28:00 PM

## 2010-11-15 NOTE — Consult Note (Signed)
[name], Morse NO.:  [id number]  MEDICAL RECORD NO.:  [id number]  LOCATION:  3005                         FACILITY:  [facility]  PHYSICIAN:  Melvyn Novas, M.D.  DATE OF BIRTH:  May 27, 1950  DATE OF CONSULTATION: DATE OF DISCHARGE:                                CONSULTATION   Tommy Morse is a 60 year old Caucasian right-handed male presenting on November 14, 2010, at 8:00 p.m. to [place] in transfer from [place].  The patient states that he had noticed on November 13, 2010, in the morning a tendency to veer to the right when he walked.  He had no associated other symptoms.  He did not feel weak or numb on the right side.  There was no change in his vision, his speech, or his swallowing ability.  He had equal grip strength bilaterally.  He was able to walk. He actually took a group of senior members of his church hiking today and had no trouble then.  During the hike, he spoke with a nurse that attends his church and after he described his transient symptoms to her, she urged him to seek medical care, this is how he presented finally under the assumption that he may have had a TIA or stroke.  PAST MEDICAL HISTORY:  The patient has young-onset coronary artery disease.  He had severe genetic triglyceridemia and hyperlipidemia.  He has hypertension, underwent a CABG at age 19, has diabetes mellitus which is diet and oral control, not insulin.  History of rectal bleeding.  STROKE RISK FACTORS:  Hyperlipidemia, diabetes mellitus, hypertension.  MEDICATIONS:  The patient is on Toprol, aspirin full size at home, glipizide, metformin, ramipril, hydrochlorothiazide, and Lovaza, fish oil.  He is allergic to STATINS.  He also cannot tolerate AMIODARONE.  FAMILY HISTORY:  Positive for early onset coronary artery disease in both parents and the maternal family.  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  He is of [place] descent,  married with three adult children.  His primary care physician is Dr. Rosiland Oz.  His cardiologist is Dr. Deborah Chalk.  He is a non-tobacco, non-alcohol, and non-drug user.  REVIEW OF SYSTEMS:  Out of a 13-system review, the patient only endorsed transient disequilibrium and some possible neck pain.  It was after he mentioned the neck pain that he also stated that he had been calling his church to prayer and was kneeling frequently over the last 3 weeks.  He assumed that he may have caused the transient disequilibrium or veering to the right by kneeling or by compressing a nerve at the right leg.  PHYSICAL EXAMINATION:  VITAL SIGNS:  The patient has a temperature of 97.8 degrees Fahrenheit; a blood pressure of 160/92 mmHg earlier today, now 138/80; heart rate is 70; respiratory rate is 12; O2 saturation is 96% on room air. GENERAL:  The patient is not obese, well groomed, eloquent.  His speech is clear.  He is alert, oriented, and shows no memory deficits.  The patient is well nourished. LUNGS:  Clear to auscultation. ABDOMEN:  Soft. NEUROLOGIC:  Cranial nerve examination shows equal sized pupils, full extraocular movements and visual fields.  No facial droop.  No facial sensory loss.  Tongue and uvula are midline.  Shoulder shrug bilaterally equal.  Bilaterally equal grip strength.  The patient was able to perform finger-nose-finger testing without hesitation or slowing on both sides and shows no tremor, dystaxia, or dysmetria.  The patient had symmetric deep tendon reflexes and no Babinski response.  He can also perform a heel-to-shin test.  He could stand for me and the Romberg test showed no swaying.  LABORATORY DATA:  His sodium was 137, potassium was 3.9, glucose was 134.  Triglycerides were 770, cholesterol total 306, LDL 154, and he has normal liver transaminases.  EKG shows a regular rate of the heart.  The patient had at one time been on amiodarone since he lists this as an  allergy, but he is not aware of ever having suffered from an arrhythmia such as atrial fibrillation.  ASSESSMENT:  I agree with the plan for the MRI brain and the full transient ischemic attack workup.  At this time, I do not assume that the patient had truly a stroke.  I believe that this could have been most likely a transient ischemic attack and that a higher blood pressure and higher blood glucose levels may have precipitated this.  The standard orders are attached to today's consultation.  The patient would like tomorrow to follow up with Neurology, Dr. Pearlean Brownie, on results of echo and carotid Doppler studies, and the swallow evaluation has meanwhile been passed.  It was not yet documented in the chart.  We will transfer aspirin to the p.o. form, and the patient's IV fluids can be hep-welled now.  The NIH stroke scale is 0 for this patient, and he has no or Rankin disability preexisting.     Melvyn Novas, M.D.     CD/MEDQ  D:  11/14/2010  T:  11/15/2010  Job:  [id number]  cc:   Colleen Can. Deborah Chalk, M.D. Pramod P. Pearlean Brownie, MD Avelino Leeds  Electronically Signed by Melvyn Novas M.D. on 11/15/2010 12:03:04 PM

## 2010-11-16 ENCOUNTER — Emergency Department (HOSPITAL_COMMUNITY)
Admission: EM | Admit: 2010-11-16 | Discharge: 2010-11-16 | Disposition: A | Discharge status: Home / Self Care | Payer: BC Managed Care – PPO | Attending: Emergency Medicine | Admitting: Emergency Medicine

## 2010-11-16 ENCOUNTER — Other Ambulatory Visit: Payer: Self-pay | Admitting: Cardiology

## 2010-11-16 ENCOUNTER — Emergency Department (HOSPITAL_COMMUNITY): Payer: BC Managed Care – PPO

## 2010-11-16 DIAGNOSIS — Z79899 Other long term (current) drug therapy: Secondary | ICD-10-CM | POA: Insufficient documentation

## 2010-11-16 DIAGNOSIS — Z8673 Personal history of transient ischemic attack (TIA), and cerebral infarction without residual deficits: Secondary | ICD-10-CM | POA: Insufficient documentation

## 2010-11-16 DIAGNOSIS — G459 Transient cerebral ischemic attack, unspecified: Secondary | ICD-10-CM | POA: Insufficient documentation

## 2010-11-16 DIAGNOSIS — I2510 Atherosclerotic heart disease of native coronary artery without angina pectoris: Secondary | ICD-10-CM | POA: Insufficient documentation

## 2010-11-16 DIAGNOSIS — R279 Unspecified lack of coordination: Secondary | ICD-10-CM | POA: Insufficient documentation

## 2010-11-16 DIAGNOSIS — R209 Unspecified disturbances of skin sensation: Secondary | ICD-10-CM | POA: Insufficient documentation

## 2010-11-16 DIAGNOSIS — I10 Essential (primary) hypertension: Secondary | ICD-10-CM | POA: Insufficient documentation

## 2010-11-16 DIAGNOSIS — E119 Type 2 diabetes mellitus without complications: Secondary | ICD-10-CM | POA: Insufficient documentation

## 2010-11-16 DIAGNOSIS — E7800 Pure hypercholesterolemia, unspecified: Secondary | ICD-10-CM | POA: Insufficient documentation

## 2010-11-16 DIAGNOSIS — I252 Old myocardial infarction: Secondary | ICD-10-CM | POA: Insufficient documentation

## 2010-11-17 ENCOUNTER — Emergency Department (HOSPITAL_COMMUNITY): Payer: BC Managed Care – PPO

## 2010-11-17 ENCOUNTER — Emergency Department (HOSPITAL_COMMUNITY)
Admission: EM | Admit: 2010-11-17 | Discharge: 2010-11-17 | Disposition: A | Discharge status: Home / Self Care | Payer: BC Managed Care – PPO | Attending: Emergency Medicine | Admitting: Emergency Medicine

## 2010-11-17 ENCOUNTER — Telehealth: Payer: Self-pay

## 2010-11-17 DIAGNOSIS — R209 Unspecified disturbances of skin sensation: Secondary | ICD-10-CM | POA: Insufficient documentation

## 2010-11-17 DIAGNOSIS — Z79899 Other long term (current) drug therapy: Secondary | ICD-10-CM | POA: Insufficient documentation

## 2010-11-17 DIAGNOSIS — R262 Difficulty in walking, not elsewhere classified: Secondary | ICD-10-CM | POA: Insufficient documentation

## 2010-11-17 DIAGNOSIS — R42 Dizziness and giddiness: Secondary | ICD-10-CM | POA: Insufficient documentation

## 2010-11-17 DIAGNOSIS — I6350 Cerebral infarction due to unspecified occlusion or stenosis of unspecified cerebral artery: Secondary | ICD-10-CM | POA: Insufficient documentation

## 2010-11-17 DIAGNOSIS — E7800 Pure hypercholesterolemia, unspecified: Secondary | ICD-10-CM | POA: Insufficient documentation

## 2010-11-17 DIAGNOSIS — I252 Old myocardial infarction: Secondary | ICD-10-CM | POA: Insufficient documentation

## 2010-11-17 DIAGNOSIS — R112 Nausea with vomiting, unspecified: Secondary | ICD-10-CM | POA: Insufficient documentation

## 2010-11-17 DIAGNOSIS — I10 Essential (primary) hypertension: Secondary | ICD-10-CM | POA: Insufficient documentation

## 2010-11-17 DIAGNOSIS — R51 Headache: Secondary | ICD-10-CM | POA: Insufficient documentation

## 2010-11-17 DIAGNOSIS — I2510 Atherosclerotic heart disease of native coronary artery without angina pectoris: Secondary | ICD-10-CM | POA: Insufficient documentation

## 2010-11-17 DIAGNOSIS — E119 Type 2 diabetes mellitus without complications: Secondary | ICD-10-CM | POA: Insufficient documentation

## 2010-11-17 LAB — URINALYSIS, ROUTINE W REFLEX MICROSCOPIC
Ketones, ur: 15 mg/dL — AB
Leukocytes, UA: NEGATIVE
Nitrite: NEGATIVE
Protein, ur: NEGATIVE mg/dL
Urobilinogen, UA: 0.2 mg/dL (ref 0.0–1.0)

## 2010-11-17 LAB — CBC
MCH: 31.4 pg (ref 26.0–34.0)
MCV: 88.1 fL (ref 78.0–100.0)
Platelets: 151 10*3/uL (ref 150–400)
RBC: 4.87 MIL/uL (ref 4.22–5.81)
RDW: 13.2 % (ref 11.5–15.5)
WBC: 6.8 10*3/uL (ref 4.0–10.5)

## 2010-11-17 LAB — DIFFERENTIAL
Basophils Relative: 0 % (ref 0–1)
Eosinophils Absolute: 0.1 10*3/uL (ref 0.0–0.7)
Eosinophils Relative: 1 % (ref 0–5)
Lymphs Abs: 1.1 10*3/uL (ref 0.7–4.0)
Monocytes Relative: 7 % (ref 3–12)
Neutrophils Relative %: 76 % (ref 43–77)

## 2010-11-17 LAB — COMPREHENSIVE METABOLIC PANEL
BUN: 20 mg/dL (ref 6–23)
CO2: 29 mEq/L (ref 19–32)
Calcium: 10 mg/dL (ref 8.4–10.5)
Chloride: 97 mEq/L (ref 96–112)
Creatinine, Ser: 0.9 mg/dL (ref 0.50–1.35)
GFR calc Af Amer: >60 mL/min (ref >60)
GFR calc non Af Amer: >60 mL/min (ref >60)
Total Bilirubin: 0.5 mg/dL (ref 0.3–1.2)

## 2010-11-17 LAB — POCT I-STAT TROPONIN I: Troponin i, poc: 0 ng/mL (ref 0.00–0.08)

## 2010-11-17 LAB — LIPASE, BLOOD: Lipase: 32 U/L (ref 11–59)

## 2010-11-17 LAB — PROTIME-INR: INR: 0.96 (ref 0.00–1.49)

## 2010-11-17 NOTE — Telephone Encounter (Signed)
Pt's wife called stating pt has a stroke last week, was d/c'd and yesterday woke up leaning more to one side, they went back to [facility] ED and d/c'd yesterday afternoon.  This morning he woke up with dizziness and vomiting.  They called their pcp who said he should not have been d/c'd.  We have not seen the pt ever.  He is scheduled for Oct. 1.  Discussed with Dr. Modesto Charon and recommended pt go back to the ED since new symptoms have arisen and we have never seen him.  Pt's wife states understanding.

## 2010-11-17 NOTE — Telephone Encounter (Signed)
Refilled Meds from fax  

## 2010-11-18 LAB — URINE CULTURE: Colony Count: NO GROWTH

## 2010-11-23 ENCOUNTER — Ambulatory Visit: Payer: BC Managed Care – PPO | Attending: Unknown Physician Specialty | Admitting: Physical Therapy

## 2010-11-23 DIAGNOSIS — IMO0001 Reserved for inherently not codable concepts without codable children: Secondary | ICD-10-CM | POA: Insufficient documentation

## 2010-11-23 DIAGNOSIS — I69998 Other sequelae following unspecified cerebrovascular disease: Secondary | ICD-10-CM | POA: Insufficient documentation

## 2010-11-23 DIAGNOSIS — R269 Unspecified abnormalities of gait and mobility: Secondary | ICD-10-CM | POA: Insufficient documentation

## 2010-11-23 DIAGNOSIS — M6281 Muscle weakness (generalized): Secondary | ICD-10-CM | POA: Insufficient documentation

## 2010-11-27 ENCOUNTER — Ambulatory Visit (INDEPENDENT_AMBULATORY_CARE_PROVIDER_SITE_OTHER): Payer: BC Managed Care – PPO | Admitting: Neurology

## 2010-11-27 ENCOUNTER — Encounter: Payer: Self-pay | Admitting: Neurology

## 2010-11-27 VITALS — BP 126/78 | HR 64 | Wt 202.0 lb

## 2010-11-27 DIAGNOSIS — I639 Cerebral infarction, unspecified: Secondary | ICD-10-CM

## 2010-11-27 DIAGNOSIS — I6350 Cerebral infarction due to unspecified occlusion or stenosis of unspecified cerebral artery: Secondary | ICD-10-CM

## 2010-11-27 NOTE — Patient Instructions (Signed)
Your CTA has been scheduled for Thursday, October 4th at 9:30am.  Please arrive to [facility] by 9:15am.  We will call you for your follow up appointment to see Dr. Modesto Charon.

## 2010-11-27 NOTE — Progress Notes (Signed)
Dear Dr. Rosiland Oz,  Thank you for having me see Tommy Morse in consultation today at [facility] for his problem with a right lateral medullary infarction.  As you may recall, he is a 60 y.o. year old male with a history of diabetes, hypertension and hyperlipidemia who presented November 13, 2010 for difficulty with balance and feeling like he was falling to the right.  He also had sensory changes on the right face.  He denies difficulty swallowing or speaking, although he did have transient doublevision.  MRI brain revealed a right lateral medullary infarct.  The MRA head, carotid U/S and echo was unremarkable.  He did have some transient worsening of symptoms but repeat MRI brain revealed no obvious change.  He now has sensory changes on right side of face, some balance difficulties with listing to the right and he feels "weak" on his right hand side.  Medical History: Past Medical History  Diagnosis Date  . Hypertension   . Hyperlipidemia     intolerance to lipid-lowering medicines  . LVH (left ventricular hypertrophy)   . Ischemic heart disease     CABG x6 - 2005  . Hypertriglyceridemia   . Diabetes mellitus   . Coronary artery disease     CABG - x6 - 2005  . 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 Cardiac catheterization 04/19/2206    EF 60-65%  . 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 Laminectomy 06/2009    L3-L4      Social History:  History   Social History  . Marital Status: Married    Spouse Name: N/A    Number of Children: N/A  . Years of Education: N/A   Social History Main Topics  . Smoking status: Never Smoker   . Smokeless tobacco: Never Used  . Alcohol Use: No  . Drug Use: No  . Sexually Active: None   Other Topics Concern  . None   Social History Narrative  . None     Family History: familial hyperlipidemia.   ROS:  13 systems  were reviewed and are notable for fatigue as well as bilateral leg pain attributed to lumbar stenosis s/p laminectomy.  All other review of systems are unremarkable.   Examination:  Filed Vitals:   11/27/10 1039  BP: 126/78  Pulse: 64  Weight: 202 lb (91.627 kg)     In Tommy, well appearing older man in NAD.  Cardiovascular: The patient has a regular rate and rhythm.  Fundoscopy:  Disks are flat. Vessel caliber within normal limits.  No diabetic changes seen.  Mental status:   The patient is oriented to person, place and time. Recent and remote memory are intact. Attention span and concentration are normal. Language including repetition, naming, following commands are intact. Fund of knowledge of current and historical events, as well as vocabulary are normal.  Cranial Nerves: Pupils are equally round and reactive to light.  Visual fields full to confrontation. Extraocular movements are intact without nystagmus. Facial sense decreased to LT on right upper face. Patient has a right sided ptosis. Muscles of facial expression are symmetric. Hearing intact to bilateral finger rub. Tongue protrusion, uvula, palate midline.  Shoulder shrug intact  Motor:  The patient has normal bulk and tone, no pronator drift and 5/5 strength bilaterally.  There are no adventitious movements.  Reflexes:  Are 2+  bilaterally in both the upper and lower extremities. Toes down.   Coordination:  Normal finger to nose.  No dysdiadokinesia.  Sensation is intact to temperature and vibration.  Position sense intact.  Gait and Station are reveal what appears to be decreased awareness of position right leg.  Sways slightly with Romberg.   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   Recommendations:  1.  Agree with switch to clopidogrel 75 daily. 2.  Start ezetimibe as prescribed. 3.  I am going to get a CTA of the neck to visualize the vertebral artery take off on the right. 4.   Will consider Neurontin or pregabalin for his nerve pain in the future if this recurs in his feet.   We will see the patient back in 3 months.   If he is stable at that time we can discharge him from clinic.  Thank you for having [place] see Tommy Morse in consultation.  Feel free to contact me with any questions.  Lupita Raider Modesto Charon, MD [facility], [place] [address] [place], [place] [address] Phone: [phone] Fax: [phone].

## 2010-11-29 ENCOUNTER — Ambulatory Visit
Payer: BC Managed Care – PPO | Attending: Unknown Physician Specialty | Admitting: Rehabilitative and Restorative Service Providers"

## 2010-11-29 ENCOUNTER — Ambulatory Visit: Payer: BC Managed Care – PPO | Admitting: Physical Therapy

## 2010-11-29 DIAGNOSIS — R269 Unspecified abnormalities of gait and mobility: Secondary | ICD-10-CM | POA: Insufficient documentation

## 2010-11-29 DIAGNOSIS — I69998 Other sequelae following unspecified cerebrovascular disease: Secondary | ICD-10-CM | POA: Insufficient documentation

## 2010-11-29 DIAGNOSIS — M6281 Muscle weakness (generalized): Secondary | ICD-10-CM | POA: Insufficient documentation

## 2010-11-29 DIAGNOSIS — IMO0001 Reserved for inherently not codable concepts without codable children: Secondary | ICD-10-CM | POA: Insufficient documentation

## 2010-11-30 ENCOUNTER — Encounter (HOSPITAL_COMMUNITY): Payer: Self-pay

## 2010-11-30 ENCOUNTER — Ambulatory Visit (HOSPITAL_COMMUNITY)
Admission: RE | Admit: 2010-11-30 | Discharge: 2010-11-30 | Disposition: A | Discharge status: Home / Self Care | Payer: BC Managed Care – PPO | Source: Ambulatory Visit | Attending: Neurology | Admitting: Neurology

## 2010-11-30 DIAGNOSIS — I639 Cerebral infarction, unspecified: Secondary | ICD-10-CM

## 2010-11-30 DIAGNOSIS — I6529 Occlusion and stenosis of unspecified carotid artery: Secondary | ICD-10-CM | POA: Insufficient documentation

## 2010-11-30 DIAGNOSIS — Z8673 Personal history of transient ischemic attack (TIA), and cerebral infarction without residual deficits: Secondary | ICD-10-CM | POA: Insufficient documentation

## 2010-11-30 MED ORDER — IOHEXOL 350 MG/ML SOLN
50.0000 mL | Freq: Once | INTRAVENOUS | Status: AC | PRN
  Administered 2010-11-30: 50 mL via INTRAVENOUS

## 2010-12-01 ENCOUNTER — Ambulatory Visit: Payer: BC Managed Care – PPO | Admitting: Physical Therapy

## 2010-12-04 ENCOUNTER — Ambulatory Visit: Payer: BC Managed Care – PPO | Admitting: Physical Therapy

## 2010-12-05 NOTE — H&P (Signed)
NAMEAUGUST, LONGEST NO.:  [id number]  MEDICAL RECORD NO.:  [id number]  LOCATION:  3005                         FACILITY:  [facility]  PHYSICIAN:  Jeoffrey Massed, MD    DATE OF BIRTH:  02-19-1951  DATE OF ADMISSION:  11/14/2010 DATE OF DISCHARGE:                             HISTORY & PHYSICAL   PRIMARY CARE PRACTITIONER:  Avelino Leeds, [place]  PRIMARY CARDIOLOGIST:  Colleen Can. Deborah Chalk, MD  CHIEF COMPLAINT:  Unsteady gait.  HISTORY OF PRESENT ILLNESS:  Tommy Morse is a very pleasant 61 year old Caucasian gentleman with a past medical history of coronary artery disease status post CABG, hypertension, dyslipidemia, diabetes mellitus who presented with the above-noted complaints.  Per the patient, he was in his usual state of health and was actually visiting friends of the family in [place] when all of a sudden he started having unsteady gait.  When asked to describe further, the patient claims that upon walking he becomes unsteady and somewhat starts to lean on to his right side.  He does not give a history of having any weakness in any of his extremities.  He denies any dysarthria or diplopia.  He claims that he has had right-sided eye pain and a little bit of headache over the past few days.  He denies any dizziness or vertigo like symptoms.  There is no history of fever, nausea, vomiting, diarrhea.  ALLERGIES:  The patient is allergic to: 1. AUGMENTIN. 2. STATINS. 3. AMIODARONE.  PAST MEDICAL HISTORY:  Significant for: 1. Diabetes. 2. Hypertension. 3. Dyslipidemia. 4. Coronary artery disease.  PAST SURGICAL HISTORY: 1. CABG. 2. Back surgery.  MEDICATIONS AT HOME: 1. Metoprolol 50 mg p.o. b.i.d. 2. Aspirin 325 mg p.o. daily. 3. Glipizide 5 mg p.o. b.i.d. 4. Metformin 1000 mg p.o. twice a day. 5. Ramipril 10 mg p.o. daily. 6. Hydrochlorothiazide 25 mg p.o. daily. 7. Lovaza 4 g p.o. daily.  FAMILY HISTORY:  Both his parents had heart  disease.  SOCIAL HISTORY:  Denies any toxic habits, works as a Optician, dispensing.  REVIEW OF SYSTEMS:  Detailed review of 12 systems were done and these are negative except for the ones noted in the HPI.  PHYSICAL EXAMINATION:  GENERAL:  Lying in bed, does not appear to be in any distress, awake and alert.  Speech clear. VITAL SIGNS:  Temperature of 97.8, pulse of 70, blood pressure of 161/92, O2 saturation of 97% on room air. HEENT:  Atraumatic, normocephalic.  Pupils equally react to light and accommodation. NECK:  Supple. CHEST:  Bilaterally clear to auscultation. CARDIOVASCULAR:  Heart sounds are regular.  No murmurs heard. ABDOMEN:  Soft, nontender, and nondistended. EXTREMITIES:  No edema. NEUROLOGIC:  The patient is awake, alert.  Speech is clear.  Cranial nerves II through XII are intact and the patient does not appear to have any focal neurological deficits.  LABORATORY DATA:  CBC done at his facility prior to his transfer shows a hemoglobin of 41.7, MCV of 91.1, and platelet count of 185.  Chemistries done at the facility prior to his transport shows a sodium of 138, potassium of 3.6, chloride of 98, bicarb of 27.3, BUN of 17, creatinine of 1.08  and a glucose of 77.  First set of troponin done in the facility prior to his transfer was negative.  Urine drug screen was negative.  RADIOLOGICAL STUDIES:  CT of the head done at the facility prior to he was transferred to [facility] showed no acute abnormality or lacunar infarcts and sinus disease.  Portable chest x-ray done in this facility prior to he was transferred showed no acute abnormality.  ASSESSMENT: 1. Possible transient ischemic attack with cerebrovascular accident. 2. History of hypertension. 3. History of diabetes. 4. History of coronary artery disease.  PLAN: 1. The patient will be admitted to telemetry unit. 2. The patient will be continued on aspirin. 3. An MRI/MRA of the brain along with a 2-D echo and  carotid Doppler     will be ordered. 4. Neurology has already been informed for the patient's admission and     they will formally consult tomorrow. 5. Further plan will depend as the patient's clinical course evolves     and further radiological studies are obtained. 6. DVT prophylaxis with Lovenox. 7. Code status.  Full code.  TOTAL TIME SPENT FOR ADMISSION:  40 minutes.     Jeoffrey Massed, MD     SG/MEDQ  D:  11/14/2010  T:  11/14/2010  Job:  [id number]  cc:   Avelino Leeds  Electronically Signed by Jeoffrey Massed  on 12/05/2010 03:51:52 PM

## 2010-12-06 ENCOUNTER — Other Ambulatory Visit: Payer: Self-pay | Admitting: Cardiology

## 2010-12-07 ENCOUNTER — Ambulatory Visit: Payer: BC Managed Care – PPO | Admitting: Physical Therapy

## 2010-12-12 ENCOUNTER — Ambulatory Visit: Payer: BC Managed Care – PPO | Admitting: Physical Therapy

## 2010-12-13 ENCOUNTER — Telehealth: Payer: Self-pay | Admitting: Neurology

## 2010-12-13 NOTE — Telephone Encounter (Signed)
Pt would like know if he is okay to drive now.

## 2010-12-14 ENCOUNTER — Ambulatory Visit: Payer: BC Managed Care – PPO | Admitting: Physical Therapy

## 2010-12-14 NOTE — Telephone Encounter (Signed)
Called pt to inform him that driving is fine. He had no further questions at this time.

## 2010-12-14 NOTE — Telephone Encounter (Signed)
If he feels safe to drive I do not see a medical reason to prevent him.  If you could give him that message that would be great.  Otherwise I can call him.

## 2010-12-18 ENCOUNTER — Ambulatory Visit: Payer: BC Managed Care – PPO | Admitting: Physical Therapy

## 2010-12-21 ENCOUNTER — Ambulatory Visit: Payer: BC Managed Care – PPO | Admitting: Physical Therapy

## 2010-12-25 ENCOUNTER — Ambulatory Visit: Payer: BC Managed Care – PPO | Admitting: Physical Therapy

## 2010-12-29 ENCOUNTER — Ambulatory Visit: Payer: BC Managed Care – PPO | Attending: Unknown Physician Specialty | Admitting: Physical Therapy

## 2010-12-29 DIAGNOSIS — IMO0001 Reserved for inherently not codable concepts without codable children: Secondary | ICD-10-CM | POA: Insufficient documentation

## 2010-12-29 DIAGNOSIS — I69998 Other sequelae following unspecified cerebrovascular disease: Secondary | ICD-10-CM | POA: Insufficient documentation

## 2010-12-29 DIAGNOSIS — M6281 Muscle weakness (generalized): Secondary | ICD-10-CM | POA: Insufficient documentation

## 2010-12-29 DIAGNOSIS — R269 Unspecified abnormalities of gait and mobility: Secondary | ICD-10-CM | POA: Insufficient documentation

## 2011-01-02 ENCOUNTER — Ambulatory Visit: Payer: BC Managed Care – PPO | Admitting: Physical Therapy

## 2011-01-04 ENCOUNTER — Ambulatory Visit: Payer: BC Managed Care – PPO | Admitting: Physical Therapy

## 2011-01-09 ENCOUNTER — Ambulatory Visit: Payer: BC Managed Care – PPO | Admitting: Physical Therapy

## 2011-01-11 ENCOUNTER — Ambulatory Visit: Payer: BC Managed Care – PPO | Admitting: Physical Therapy

## 2011-01-15 ENCOUNTER — Ambulatory Visit: Payer: BC Managed Care – PPO | Admitting: Physical Therapy

## 2011-01-17 ENCOUNTER — Ambulatory Visit: Payer: BC Managed Care – PPO | Admitting: Physical Therapy

## 2011-01-23 ENCOUNTER — Ambulatory Visit: Payer: BC Managed Care – PPO | Admitting: Physical Therapy

## 2011-01-25 ENCOUNTER — Ambulatory Visit: Payer: BC Managed Care – PPO | Admitting: Physical Therapy

## 2011-01-26 ENCOUNTER — Other Ambulatory Visit: Payer: Self-pay | Admitting: Cardiology

## 2011-02-12 ENCOUNTER — Telehealth: Payer: Self-pay | Admitting: Neurology

## 2011-02-12 NOTE — Telephone Encounter (Signed)
Pt's ortho doc would like to know if it's okay for pt to come off of his Plavix for 5 days in order to receive steroid injections.

## 2011-02-13 NOTE — Telephone Encounter (Signed)
The patient returned my call. I told him that per Dr. Modesto Charon, ok to stop Plavix as needed for steroid injections. I told the patient that he could let us know if he needed the ok in writing. The patient says that he will wait until January to get the injections and that they will be done by Dr. Donnal Debar. Ethelene Hal. Patient reminded of appointment on Jan. 9th. No other issues.

## 2011-02-13 NOTE — Telephone Encounter (Signed)
Left a message for the patient to call me at all three listed phone numbers.

## 2011-02-13 NOTE — Telephone Encounter (Signed)
Jan - Could you call the pt and let him know that it is ok for him to stop the clopidogrel for 5 days.  Stopping it of course has a small risk, but if its is a procedure that needs to be done then it is reasonable to stop it for that time.

## 2011-02-17 ENCOUNTER — Other Ambulatory Visit: Payer: Self-pay | Admitting: Nurse Practitioner

## 2011-02-27 HISTORY — PX: BACK SURGERY: SHX140

## 2011-03-07 ENCOUNTER — Ambulatory Visit: Payer: BC Managed Care – PPO | Admitting: Neurology

## 2011-03-09 ENCOUNTER — Ambulatory Visit (INDEPENDENT_AMBULATORY_CARE_PROVIDER_SITE_OTHER): Payer: BC Managed Care – PPO | Admitting: Neurology

## 2011-03-09 ENCOUNTER — Encounter: Payer: Self-pay | Admitting: Neurology

## 2011-03-09 VITALS — BP 134/82 | HR 80 | Ht 73.0 in | Wt 205.0 lb

## 2011-03-09 DIAGNOSIS — M48061 Spinal stenosis, lumbar region without neurogenic claudication: Secondary | ICD-10-CM

## 2011-03-09 MED ORDER — GABAPENTIN 300 MG PO CAPS: ORAL_CAPSULE | ORAL | Status: DC

## 2011-03-09 NOTE — Progress Notes (Signed)
Dear Dr. Rosiland Oz,  I saw  Tommy Morse back in [facility] for his problem with both a previous right lateral medullary infarct with an unremarkable MRA neck and head .  He has had gradual resolution of his symptoms although does complain of some sensory changes in his face.  He has had no other spell.  His biggest complaint is that he continues to have significant problems with bilateral leg pain, worse on the left.  He has a history of lumbar stenosis s/p laminectomy of L3-L4.  Interestingly he complains of left leg pain going to the foot as well as shooting pain into his testicles.  He also has had problems fully emptying his bladder but has not  had  bowel or bladder incontinence.  He recently got epidural steroids which seemed to help his right leg pain.  His orthopedist, Dr. Shelle Iron has not reordered an MRI l-spine but has talked about doing this in the future.  Medical history, social history, and family history were reviewed and have not changed since the last clinic visit.  Current Outpatient Prescriptions on File Prior to Visit  Medication Sig Dispense Refill  . clopidogrel (PLAVIX) 75 MG tablet Take 75 mg by mouth daily.        Marland Kitchen glipiZIDE (GLUCOTROL) 5 MG tablet Take 5 mg by mouth 2 (two) times daily before a meal.        . hydrochlorothiazide (HYDRODIURIL) 25 MG tablet TAKE 1 TABLET BY MOUTH EVERY DAY  30 tablet  5  . metFORMIN (GLUCOPHAGE) 500 MG tablet TAKE 2 TABLETS BY MOUTH 2 TIMES DAILY WITH A MEAL.  120 tablet  5  . metoprolol (LOPRESSOR) 50 MG tablet Take 1 tablet (50 mg total) by mouth 2 (two) times daily.  60 tablet  11  . omega-3 acid ethyl esters (LOVAZA) 1 G capsule Take 2 g by mouth daily.        . ramipril (ALTACE) 10 MG capsule TAKE 1 CAPSULE BY MOUTH EVERY DAY**(NEED FOLLOW-UP APPOINTMENT PER MD)**  30 capsule  2    Allergies  Allergen Reactions  . Amiodarone   . Augmentin   . Statins     ROS:  13 systems were reviewed and are notable for  chronic back pain.  All other review of systems are unremarkable.  Exam: . Filed Vitals:   03/09/11 0926  BP: 134/82  Pulse: 80  Height: 6\' 1"  (1.854 m)  Weight: 205 lb (92.987 kg)    In general, well appearing older man.  Motor:  Normal bulk.  5/5 bilaterally.    Reflexes:  2+ thoughout, toes down.  Decreased cremasteric reflex bilaterally, especially on left.  Also bulbocavernous reflex was decreased on the left.  Sensation:   Abnormal sensation to pinprick left L5-S1-S5 dermatomes   Gait:  Normal gait and station.  Romberg negative.  Impression/Recs: 1.  Right medullary infarction - thought to be small vessel.  Symptoms improving.  Will continue on clopidogrel 75 daily. 2.  History of lumbar stenosis - with left leg pain and left testicle pain.  While I think it is unlikely I am worried about a cauda equina syndrome, although it would seem here to be one sided.  I am going to get an MRI L-spine with and without to look for post procedural scar tissue as well as other pathology.  I have started the patient on GBP 300 tid.  We will see the patient back in 3 months.  Lupita Raider Modesto Charon,  MD [facility], [place]

## 2011-03-09 NOTE — Patient Instructions (Addendum)
Gabapentin - take 1 tab at night for 1 week, then take 1 tab twice a day for 1 week, then take 1 tab three times a day from then on.  Your MRI is scheduled at [facility] located at [address] [place]. on Monday, January 14th at 6:45 pm. Please check in at 6:00 pm. [phone].  Your prescription has been sent to your pharmacy.

## 2011-03-12 ENCOUNTER — Ambulatory Visit
Admission: RE | Admit: 2011-03-12 | Discharge: 2011-03-12 | Disposition: A | Discharge status: Home / Self Care | Payer: BC Managed Care – PPO | Source: Ambulatory Visit | Attending: Neurology | Admitting: Neurology

## 2011-03-12 DIAGNOSIS — M48061 Spinal stenosis, lumbar region without neurogenic claudication: Secondary | ICD-10-CM

## 2011-03-12 MED ORDER — GADOBENATE DIMEGLUMINE 529 MG/ML IV SOLN
19.0000 mL | Freq: Once | INTRAVENOUS | Status: AC | PRN
  Administered 2011-03-12: 19 mL via INTRAVENOUS

## 2011-03-19 ENCOUNTER — Telehealth: Payer: Self-pay | Admitting: Neurology

## 2011-03-19 NOTE — Telephone Encounter (Signed)
Message copied by Benay Spice on Mon Mar 19, 2011  9:24 AM ------      Message from: Milas Gain      Created: Sun Mar 18, 2011  9:56 PM       Jan ... Let Tommy Morse know that his MRI of his lumbar spine looked ok, with no obvious compression of the spinal cord or nerve roots that would account for his symptoms.  If you could mail him a copy of his MRI report so that he can take it to his orthopedist that would be great as well.

## 2011-03-19 NOTE — Telephone Encounter (Signed)
Called and spoke with the patient. Information given as directed by Dr. Modesto Charon re: MR L spine results. Will mail copy of the report to his home address. No issues voiced at this time.

## 2011-04-04 ENCOUNTER — Other Ambulatory Visit: Payer: Self-pay | Admitting: Neurology

## 2011-04-04 ENCOUNTER — Telehealth: Payer: Self-pay | Admitting: Neurology

## 2011-04-04 DIAGNOSIS — M5410 Radiculopathy, site unspecified: Secondary | ICD-10-CM

## 2011-04-04 NOTE — Telephone Encounter (Signed)
Called and spoke with the patient as verbally directed by Dr. Modesto Charon re: NCV/EMG left lower extremity. I scheduled the appointment and called to let the patient know and he states that he just had this test on Monday, Feb. 4th with Dr. Ethelene Hal. I did call [facility] to check and the patient did indeed have the test done on bilateral lower extremities. Per the patient the next step will be a myelogram. He will work with Dr. Shelle Iron to set up. No other issues.

## 2011-04-06 ENCOUNTER — Other Ambulatory Visit: Payer: Self-pay | Admitting: Specialist

## 2011-04-06 DIAGNOSIS — M4800 Spinal stenosis, site unspecified: Secondary | ICD-10-CM

## 2011-04-08 NOTE — Progress Notes (Signed)
Received results of EMG/NCS done by Dr. Ethelene Hal at [facility].  Normal study of left lower limb, but did not study hip flexors.

## 2011-04-12 ENCOUNTER — Ambulatory Visit
Admission: RE | Admit: 2011-04-12 | Discharge: 2011-04-12 | Disposition: A | Discharge status: Home / Self Care | Payer: BC Managed Care – PPO | Source: Ambulatory Visit | Attending: Specialist | Admitting: Specialist

## 2011-04-12 VITALS — BP 92/55 | HR 69 | Ht 73.0 in | Wt 205.0 lb

## 2011-04-12 DIAGNOSIS — M4800 Spinal stenosis, site unspecified: Secondary | ICD-10-CM

## 2011-04-12 MED ORDER — DIAZEPAM 5 MG PO TABS
10.0000 mg | ORAL_TABLET | Freq: Once | ORAL | Status: AC
  Administered 2011-04-12: 10 mg via ORAL

## 2011-04-12 MED ORDER — ONDANSETRON HCL 4 MG/2ML IJ SOLN: 4.0000 mg | Freq: Four times a day (QID) | INTRAMUSCULAR | Status: DC | PRN

## 2011-04-12 NOTE — Progress Notes (Signed)
Patient states he has been off Plavix for the past five days.  jkl 

## 2011-04-12 NOTE — Discharge Instructions (Signed)
Myelogram Discharge Instructions  1. Go home and rest quietly for the next 24 hours.  It is important to lie flat for the next 24 hours.  Get up only to go to the restroom.  You may lie in the bed or on a couch on your back, your stomach, your left side or your right side.  You may have one pillow under your head.  You may have pillows between your knees while you are on your side or under your knees while you are on your back.  2. DO NOT drive today.  Recline the seat as far back as it will go, while still wearing your seat belt, on the way home.  3. You may get up to go to the bathroom as needed.  You may sit up for 10 minutes to eat.  You may resume your normal diet and medications unless otherwise indicated.  Drink lots of extra fluids today and tomorrow.  4. The incidence of headache, nausea, or vomiting is about 5% (one in 20 patients).  If you develop a headache, lie flat and drink plenty of fluids until the headache goes away.  Caffeinated beverages may be helpful.  If you develop severe nausea and vomiting or a headache that does not go away with flat bed rest, call [phone].  5. You may resume normal activities after your 24 hours of bed rest is over; however, do not exert yourself strongly or do any heavy lifting tomorrow.  6. Call your physician for a follow-up appointment.  The results of your myelogram will be sent directly to your physician by the following day.  7. If you have any questions or if complications develop after you arrive home, please call [phone].  Discharge instructions have been explained to the patient.  The patient, or the person responsible for the patient, fully understands these instructions.   May resume plavix today.

## 2011-04-19 ENCOUNTER — Other Ambulatory Visit: Payer: Self-pay | Admitting: *Deleted

## 2011-04-19 MED ORDER — RAMIPRIL 10 MG PO CAPS: 10.0000 mg | ORAL_CAPSULE | Freq: Every day | ORAL | Status: DC

## 2011-04-20 ENCOUNTER — Encounter: Payer: Self-pay | Admitting: Neurology

## 2011-04-20 ENCOUNTER — Ambulatory Visit (INDEPENDENT_AMBULATORY_CARE_PROVIDER_SITE_OTHER): Payer: BC Managed Care – PPO | Admitting: Neurology

## 2011-04-20 VITALS — BP 136/84 | HR 84 | Ht 73.0 in | Wt 209.0 lb

## 2011-04-20 DIAGNOSIS — M5430 Sciatica, unspecified side: Secondary | ICD-10-CM

## 2011-04-20 DIAGNOSIS — M5432 Sciatica, left side: Secondary | ICD-10-CM | POA: Insufficient documentation

## 2011-04-20 MED ORDER — PREGABALIN 50 MG PO CAPS: ORAL_CAPSULE | ORAL | Status: DC

## 2011-04-20 NOTE — Progress Notes (Signed)
Dear Dr. Rosiland Oz,  I saw  Tommy Morse back in [facility] for his problem with left leg pain and a history of a right lateral medullary infarct .  He continues to complain of improving sensory changes on his right face.  However, at his last visit he was complaining of radiating left leg pain from his buttocks to his left foot.  It also included his testicles.  I got an MRI of his L-spine which revealed his old L4-L5 laminectomy without foraminal compression at any level of the L-spine.  He had a subsequent myelogram ordered by his ortho surgeon Dr. Shelle Iron that also did not explain his radiating pain.  An EMG/NCS of his left leg done by Dr. Ethelene Hal was also unremarkable.  He tried to increase his gabapentin to 600 tid.  This caused grogginess and he was unsure it was helping.  However, when he stopped it the pain got considerably worse.  He denies precipitating factors for the pain.  Not relieved by movement or certain positions.  Dr. Shelle Iron felt it might be vascular so ordered an ABI.  Medical history, social history, and family history were reviewed and have not changed since the last clinic visit.  Current Outpatient Prescriptions on File Prior to Visit  Medication Sig Dispense Refill  . clopidogrel (PLAVIX) 75 MG tablet Take 75 mg by mouth daily.        Marland Kitchen ezetimibe (ZETIA) 10 MG tablet Take 10 mg by mouth daily.      Marland Kitchen glipiZIDE (GLUCOTROL) 5 MG tablet Take 5 mg by mouth 2 (two) times daily before a meal.        . hydrochlorothiazide (HYDRODIURIL) 25 MG tablet TAKE 1 TABLET BY MOUTH EVERY DAY  30 tablet  5  . metFORMIN (GLUCOPHAGE) 500 MG tablet TAKE 2 TABLETS BY MOUTH 2 TIMES DAILY WITH A MEAL.  120 tablet  5  . metoprolol (LOPRESSOR) 50 MG tablet Take 1 tablet (50 mg total) by mouth 2 (two) times daily.  60 tablet  11  . omega-3 acid ethyl esters (LOVAZA) 1 G capsule Take 2 g by mouth daily.        . ramipril (ALTACE) 10 MG capsule Take 1 capsule (10 mg total) by mouth daily.   30 capsule  2    Allergies  Allergen Reactions  . Amiodarone Other (See Comments)    "Made me Dr. Domenick Gong and [name]."  . Augmentin Rash  . Statins Other (See Comments)    "Puts me in a funk."    ROS:  13 systems were reviewed and are notable for right facial numbness.  Also, some difficulty emptying bladder.  All other review of systems are unremarkable.  Exam: . Filed Vitals:   04/20/11 0957  BP: 136/84  Pulse: 84  Height: 6\' 1"  (1.854 m)  Weight: 209 lb (94.802 kg)    In general,  more tired appearing today.   Motor:  Normal bulk and tone, no drift and 5/5 muscle strength bilaterally.  Reflexes:  2+ thoughout, toes down.  Gait:  Antalgic.  Impression/Recommendations: Neuropathic pain involving the left leg.  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  I am going to try to start Lyrica 50 tid.  This can be increased.  If the pain worsens or does not improve I would repeat an EMG/NCS to look for sciatic neuropathy.    We will see the patient back in 2 months.  Lupita Raider Modesto Charon, MD [facility], [place]

## 2011-04-20 NOTE — Patient Instructions (Signed)
Lyrica 50mg  caps  take 1 at night for 5 days, then 1 twice a day for 5 days,  then 1 three times a day from then on.

## 2011-04-23 ENCOUNTER — Other Ambulatory Visit: Payer: Self-pay | Admitting: Cardiology

## 2011-04-23 DIAGNOSIS — I70219 Atherosclerosis of native arteries of extremities with intermittent claudication, unspecified extremity: Secondary | ICD-10-CM

## 2011-04-24 ENCOUNTER — Telehealth: Payer: Self-pay | Admitting: *Deleted

## 2011-04-24 NOTE — Telephone Encounter (Signed)
New Msg: Pt calling wanting to speak with nurse to find out who pt care should be transferred to following Dr. Ronnald Nian retirement. Please return pt call to discuss further.

## 2011-04-24 NOTE — Telephone Encounter (Signed)
Chart said he is to establish with Dr Shirlee Latch, app made.

## 2011-04-25 ENCOUNTER — Ambulatory Visit: Payer: BC Managed Care – PPO | Admitting: Nurse Practitioner

## 2011-04-26 ENCOUNTER — Encounter (INDEPENDENT_AMBULATORY_CARE_PROVIDER_SITE_OTHER): Payer: BC Managed Care – PPO

## 2011-04-26 DIAGNOSIS — I739 Peripheral vascular disease, unspecified: Secondary | ICD-10-CM

## 2011-04-26 DIAGNOSIS — I70219 Atherosclerosis of native arteries of extremities with intermittent claudication, unspecified extremity: Secondary | ICD-10-CM

## 2011-05-13 ENCOUNTER — Inpatient Hospital Stay (HOSPITAL_COMMUNITY)
Admission: EM | Admit: 2011-05-13 | Discharge: 2011-05-14 | DRG: 543 | Disposition: A | Discharge status: Home / Self Care | Payer: BC Managed Care – PPO | Attending: Cardiology | Admitting: Cardiology

## 2011-05-13 ENCOUNTER — Other Ambulatory Visit: Payer: Self-pay

## 2011-05-13 ENCOUNTER — Encounter (HOSPITAL_COMMUNITY): Payer: Self-pay | Admitting: Emergency Medicine

## 2011-05-13 ENCOUNTER — Emergency Department (HOSPITAL_COMMUNITY): Payer: BC Managed Care – PPO

## 2011-05-13 DIAGNOSIS — Z79899 Other long term (current) drug therapy: Secondary | ICD-10-CM

## 2011-05-13 DIAGNOSIS — I10 Essential (primary) hypertension: Secondary | ICD-10-CM

## 2011-05-13 DIAGNOSIS — Z7902 Long term (current) use of antithrombotics/antiplatelets: Secondary | ICD-10-CM

## 2011-05-13 DIAGNOSIS — G609 Hereditary and idiopathic neuropathy, unspecified: Secondary | ICD-10-CM | POA: Diagnosis present

## 2011-05-13 DIAGNOSIS — I517 Cardiomegaly: Secondary | ICD-10-CM | POA: Diagnosis present

## 2011-05-13 DIAGNOSIS — IMO0001 Reserved for inherently not codable concepts without codable children: Secondary | ICD-10-CM | POA: Diagnosis present

## 2011-05-13 DIAGNOSIS — R079 Chest pain, unspecified: Secondary | ICD-10-CM

## 2011-05-13 DIAGNOSIS — E119 Type 2 diabetes mellitus without complications: Secondary | ICD-10-CM | POA: Diagnosis present

## 2011-05-13 DIAGNOSIS — Z951 Presence of aortocoronary bypass graft: Secondary | ICD-10-CM

## 2011-05-13 DIAGNOSIS — I2510 Atherosclerotic heart disease of native coronary artery without angina pectoris: Secondary | ICD-10-CM

## 2011-05-13 DIAGNOSIS — R0789 Other chest pain: Principal | ICD-10-CM | POA: Diagnosis present

## 2011-05-13 DIAGNOSIS — Z7982 Long term (current) use of aspirin: Secondary | ICD-10-CM

## 2011-05-13 DIAGNOSIS — Z888 Allergy status to other drugs, medicaments and biological substances status: Secondary | ICD-10-CM

## 2011-05-13 DIAGNOSIS — E785 Hyperlipidemia, unspecified: Secondary | ICD-10-CM

## 2011-05-13 DIAGNOSIS — I509 Heart failure, unspecified: Secondary | ICD-10-CM | POA: Diagnosis present

## 2011-05-13 DIAGNOSIS — I5032 Chronic diastolic (congestive) heart failure: Secondary | ICD-10-CM | POA: Diagnosis present

## 2011-05-13 DIAGNOSIS — I200 Unstable angina: Secondary | ICD-10-CM

## 2011-05-13 LAB — CBC
MCH: 31.1 pg (ref 26.0–34.0)
MCHC: 34.4 g/dL (ref 30.0–36.0)
MCHC: 35.2 g/dL (ref 30.0–36.0)
MCV: 90.3 fL (ref 78.0–100.0)
Platelets: 167 10*3/uL (ref 150–400)
Platelets: 160 10*3/uL (ref 150–400)
RDW: 14.2 % (ref 11.5–15.5)
WBC: 10.2 10*3/uL (ref 4.0–10.5)

## 2011-05-13 LAB — COMPREHENSIVE METABOLIC PANEL
ALT: 47 U/L (ref 0–53)
AST: 26 U/L (ref 0–37)
Calcium: 9.1 mg/dL (ref 8.4–10.5)
Creatinine, Ser: 0.99 mg/dL (ref 0.50–1.35)
GFR calc Af Amer: >90 mL/min (ref >90)
GFR calc non Af Amer: 87 mL/min — ABNORMAL LOW (ref >90)
Sodium: 135 mEq/L (ref 135–145)
Total Protein: 6.9 g/dL (ref 6.0–8.3)

## 2011-05-13 LAB — CARDIAC PANEL(CRET KIN+CKTOT+MB+TROPI)
CK, MB: 2.4 ng/mL (ref 0.3–4.0)
Relative Index: INVALID (ref 0.0–2.5)
Total CK: 86 U/L (ref 7–232)

## 2011-05-13 LAB — URINALYSIS, ROUTINE W REFLEX MICROSCOPIC
Bilirubin Urine: NEGATIVE
Ketones, ur: NEGATIVE mg/dL
Leukocytes, UA: NEGATIVE
Nitrite: NEGATIVE
Specific Gravity, Urine: 1.014 (ref 1.005–1.030)
Urobilinogen, UA: 0.2 mg/dL (ref 0.0–1.0)
pH: 6 (ref 5.0–8.0)

## 2011-05-13 LAB — POCT I-STAT TROPONIN I: Troponin i, poc: 0 ng/mL (ref 0.00–0.08)

## 2011-05-13 LAB — POCT I-STAT, CHEM 8
Calcium, Ion: 1.19 mmol/L (ref 1.12–1.32)
Chloride: 102 mEq/L (ref 96–112)
Glucose, Bld: 108 mg/dL — ABNORMAL HIGH (ref 70–99)
HCT: 46 % (ref 39.0–52.0)
Hemoglobin: 15.6 g/dL (ref 13.0–17.0)
TCO2: 27 mmol/L (ref 0–100)

## 2011-05-13 LAB — MRSA PCR SCREENING: MRSA by PCR: NEGATIVE

## 2011-05-13 LAB — APTT: aPTT: 27 seconds (ref 24–37)

## 2011-05-13 LAB — PROTIME-INR: INR: 0.89 (ref 0.00–1.49)

## 2011-05-13 MED ORDER — INSULIN ASPART 100 UNIT/ML ~~LOC~~ SOLN
0.0000 [IU] | Freq: Three times a day (TID) | SUBCUTANEOUS | Status: DC
  Administered 2011-05-14: 2 [IU] via SUBCUTANEOUS

## 2011-05-13 MED ORDER — EZETIMIBE 10 MG PO TABS
10.0000 mg | ORAL_TABLET | Freq: Every day | ORAL | Status: DC
  Administered 2011-05-13 – 2011-05-14 (×2): 10 mg via ORAL
  Filled 2011-05-13 (×2): qty 1

## 2011-05-13 MED ORDER — CLOPIDOGREL BISULFATE 75 MG PO TABS
75.0000 mg | ORAL_TABLET | Freq: Every day | ORAL | Status: DC
  Administered 2011-05-13 – 2011-05-14 (×2): 75 mg via ORAL
  Filled 2011-05-13 (×2): qty 1

## 2011-05-13 MED ORDER — ASPIRIN 300 MG RE SUPP
300.0000 mg | RECTAL | Status: AC
  Filled 2011-05-13: qty 1

## 2011-05-13 MED ORDER — RAMIPRIL 10 MG PO CAPS
10.0000 mg | ORAL_CAPSULE | Freq: Every day | ORAL | Status: DC
  Administered 2011-05-13 – 2011-05-14 (×2): 10 mg via ORAL
  Filled 2011-05-13 (×2): qty 1

## 2011-05-13 MED ORDER — METOPROLOL TARTRATE 25 MG PO TABS
50.0000 mg | ORAL_TABLET | Freq: Two times a day (BID) | ORAL | Status: DC
  Administered 2011-05-13 – 2011-05-14 (×2): 50 mg via ORAL
  Filled 2011-05-13 (×3): qty 2

## 2011-05-13 MED ORDER — POTASSIUM CHLORIDE CRYS ER 20 MEQ PO TBCR
40.0000 meq | EXTENDED_RELEASE_TABLET | Freq: Once | ORAL | Status: AC
  Administered 2011-05-13: 40 meq via ORAL
  Filled 2011-05-13: qty 2

## 2011-05-13 MED ORDER — OMEGA-3-ACID ETHYL ESTERS 1 G PO CAPS
2.0000 g | ORAL_CAPSULE | Freq: Every day | ORAL | Status: DC
  Administered 2011-05-13 – 2011-05-14 (×2): 2 g via ORAL
  Filled 2011-05-13 (×2): qty 2

## 2011-05-13 MED ORDER — SODIUM CHLORIDE 0.9 % IV SOLN
1000.0000 mL | INTRAVENOUS | Status: DC
  Administered 2011-05-13 – 2011-05-14 (×3): 1000 mL via INTRAVENOUS

## 2011-05-13 MED ORDER — HEPARIN (PORCINE) IN NACL 100-0.45 UNIT/ML-% IJ SOLN
1100.0000 [IU]/h | INTRAMUSCULAR | Status: DC
  Administered 2011-05-13: 1000 [IU]/h via INTRAVENOUS
  Filled 2011-05-13: qty 250

## 2011-05-13 MED ORDER — PREGABALIN 50 MG PO CAPS
50.0000 mg | ORAL_CAPSULE | Freq: Two times a day (BID) | ORAL | Status: DC
  Administered 2011-05-13 – 2011-05-14 (×2): 50 mg via ORAL
  Filled 2011-05-13 (×2): qty 1

## 2011-05-13 MED ORDER — OXYCODONE HCL 5 MG PO TABS
5.0000 mg | ORAL_TABLET | ORAL | Status: DC | PRN
  Administered 2011-05-13: 5 mg via ORAL
  Filled 2011-05-13: qty 1

## 2011-05-13 MED ORDER — NITROGLYCERIN 0.4 MG SL SUBL: 0.4000 mg | SUBLINGUAL_TABLET | SUBLINGUAL | Status: DC | PRN

## 2011-05-13 MED ORDER — ACETAMINOPHEN 325 MG PO TABS
650.0000 mg | ORAL_TABLET | ORAL | Status: DC | PRN
  Administered 2011-05-14: 650 mg via ORAL
  Filled 2011-05-13: qty 2
  Filled 2011-05-13: qty 1

## 2011-05-13 MED ORDER — OXYCODONE-ACETAMINOPHEN 10-325 MG PO TABS: 1.0000 | ORAL_TABLET | ORAL | Status: DC | PRN

## 2011-05-13 MED ORDER — HYDROCHLOROTHIAZIDE 25 MG PO TABS
25.0000 mg | ORAL_TABLET | Freq: Every day | ORAL | Status: DC
  Administered 2011-05-13 – 2011-05-14 (×2): 25 mg via ORAL
  Filled 2011-05-13 (×2): qty 1

## 2011-05-13 MED ORDER — OXYCODONE-ACETAMINOPHEN 5-325 MG PO TABS: 1.0000 | ORAL_TABLET | ORAL | Status: DC | PRN

## 2011-05-13 MED ORDER — HEPARIN BOLUS VIA INFUSION
4000.0000 [IU] | Freq: Once | INTRAVENOUS | Status: AC
  Administered 2011-05-13: 4000 [IU] via INTRAVENOUS

## 2011-05-13 MED ORDER — NITROGLYCERIN IN D5W 200-5 MCG/ML-% IV SOLN
5.0000 ug/min | INTRAVENOUS | Status: DC
  Administered 2011-05-13: 5 ug/min via INTRAVENOUS
  Filled 2011-05-13: qty 250

## 2011-05-13 MED ORDER — INSULIN ASPART 100 UNIT/ML ~~LOC~~ SOLN: 0.0000 [IU] | Freq: Every day | SUBCUTANEOUS | Status: DC

## 2011-05-13 MED ORDER — ASPIRIN 81 MG PO CHEW
324.0000 mg | CHEWABLE_TABLET | ORAL | Status: AC
  Administered 2011-05-13: 324 mg via ORAL
  Filled 2011-05-13: qty 4

## 2011-05-13 MED ORDER — ASPIRIN EC 81 MG PO TBEC
81.0000 mg | DELAYED_RELEASE_TABLET | Freq: Every day | ORAL | Status: DC
  Administered 2011-05-14: 81 mg via ORAL
  Filled 2011-05-13: qty 1

## 2011-05-13 MED ORDER — MORPHINE SULFATE 4 MG/ML IJ SOLN
4.0000 mg | Freq: Once | INTRAMUSCULAR | Status: AC
  Administered 2011-05-13: 4 mg via INTRAVENOUS
  Filled 2011-05-13: qty 1

## 2011-05-13 MED ORDER — ONDANSETRON HCL 4 MG/2ML IJ SOLN
4.0000 mg | Freq: Four times a day (QID) | INTRAMUSCULAR | Status: DC | PRN
  Administered 2011-05-14: 4 mg via INTRAVENOUS

## 2011-05-13 NOTE — Progress Notes (Signed)
ANTICOAGULATION CONSULT NOTE - Initial Consult  Pharmacy Consult for UFH Indication: chest pain/ACS  Allergies  Allergen Reactions  . Amiodarone Other (See Comments)    "Made me Dr. Domenick Gong and [name]."  . Augmentin Rash  . Statins Other (See Comments)    "Puts me in a funk."    Patient Measurements: Height: 6' 0.83" (185 cm) Weight: 208 lb 15.9 oz (94.8 kg) IBW/kg (Calculated) : 79.52   Vital Signs: Temp: 98.2 F (36.8 C) (03/17 1737) Temp src: Oral (03/17 1737) BP: 126/70 mmHg (03/17 1737) Pulse Rate: 72  (03/17 1737)  Labs:  Basename 05/13/11 1801  HGB 15.0  HCT 43.6  PLT 167  APTT --  LABPROT --  INR --  HEPARINUNFRC --  CREATININE --  CKTOTAL --  CKMB --  TROPONINI --   Estimated Creatinine Clearance: 96.9 ml/min (by C-G formula based on Cr of 0.9).  Medical History: Past Medical History  Diagnosis Date  . Hypertension   . Hyperlipidemia     intolerance to lipid-lowering medicines  . LVH (left ventricular hypertrophy)   . Ischemic heart disease     CABG x6 - 2005  . Hypertriglyceridemia   . Diabetes mellitus   . Coronary artery disease     CABG - x6 - 2005  . 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 Neuropathy     Medications:   (Not in a hospital admission)  Assessment: 61 y/o male patient admitted with chest pain requiring anticoagulation for r/o MI.  Goal of Therapy:  Heparin level 0.3-0.7 units/ml   Plan:  Heparin 4000 unit IV bolus followed by 1000 units/hr. Check 6 hour heparin level, daily cbc and heparin level.  Verlene Mayer, PharmD, BCPS Pager [phone] 05/13/2011,6:29 PM

## 2011-05-13 NOTE — ED Notes (Signed)
Called to give report to [place], Charity fundraiser.  Will call back in 15 minutes.

## 2011-05-13 NOTE — ED Notes (Signed)
Pt to ED via EMS with c/o chest pain.  Onset approximately 0900 this AM.  States at that time became diaphoretic.  He then took 2 baby Aspirin.  Associated symptoms were fatique and increase in chest pain.  Went to Urgent Care in [facility] and was sent to ED from there.

## 2011-05-13 NOTE — ED Provider Notes (Cosign Needed Addendum)
History     CSN: [id number]  Arrival date & time 05/13/11  1717   First MD Initiated Contact with Patient 05/13/11 1722      Chief Complaint  Patient presents with  . 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 He went on for lunch and took 2 baby aspirins with some relief. He took a nap and woke up and had the pain again but was intensified. It's different from what he had in the past when he had coronary artery bypass surgery in 2008. Then he had shortness of breath on exertion. EMS was called, and he was given aspirin and nitroglycerin and was transported to [facility] [place].  Patient is a 61 y.o. male presenting with chest pain. The history is provided by the patient and medical records. No language interpreter was used.  Chest Pain The chest pain began 3 - 5 hours ago. Duration of episode(s) is 60 minutes. Chest pain occurs intermittently. The chest pain is worsening. Associated with: Nothing. At its most intense, the pain is at 6/10. The pain is currently at 6/10. The quality of the pain is described as aching. The pain does not radiate. Exacerbated by: Nothing.  Associated symptoms include diaphoresis. He tried nitroglycerin, aspirin and oxygen for the symptoms. Risk factors include male gender (Known coronary artery disease with prior CABG.).  His past medical history is significant for CAD.     Past Medical History  Diagnosis Date  . Hypertension   . Hyperlipidemia     intolerance to lipid-lowering medicines  . LVH (left ventricular hypertrophy)   . Ischemic heart disease     CABG x6 - 2005  . Hypertriglyceridemia   . Diabetes mellitus   . Coronary artery disease     CABG - x6 - 2005  . 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 Neuropathy     Past Surgical History  Procedure Date  . Cardiac catheterization 04/19/2206    EF 60-65%  . 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 Laminectomy 06/2009    L3-L4    Family History  Problem Relation Age of Onset  . Heart disease Mother     valvular heart disease / Strong Family History  . Heart attack Father   . Heart attack Mother   . Hypertension Brother     History  Substance Use Topics  . Smoking status: Never Smoker   . Smokeless tobacco: Never Used  . Alcohol Use: No      Review of Systems  Constitutional: Positive for diaphoresis.  HENT: Negative.   Eyes: Negative.   Respiratory: Negative.   Cardiovascular: Positive for chest pain.  Gastrointestinal: Negative.   Genitourinary: Negative.   Musculoskeletal:       He is being treated for sciatica in the left leg.  Neurological: Negative.   Psychiatric/Behavioral: Negative.     Allergies  Amiodarone; Augmentin; and Statins  Home Medications   Current Outpatient Rx  Name Route Sig Dispense Refill  . CLOPIDOGREL BISULFATE 75 MG PO TABS Oral Take 75 mg by mouth daily.      Marland Kitchen EZETIMIBE 10 MG PO TABS Oral Take 10 mg by mouth daily.    Marland Kitchen GLIPIZIDE 5 MG PO TABS Oral Take 5 mg by  mouth daily.     Marland Kitchen HYDROCHLOROTHIAZIDE 25 MG PO TABS Oral Take 25 mg by mouth daily.    Marland Kitchen METFORMIN HCL 500 MG PO TABS Oral Take 1,000 mg by mouth 2 (two) times daily with a meal.    . METOPROLOL TARTRATE 50 MG PO TABS Oral Take 50 mg by mouth 2 (two) times daily.    . OMEGA-3-ACID ETHYL ESTERS 1 G PO CAPS Oral Take 2 g by mouth daily.     . OXYCODONE-ACETAMINOPHEN 10-325 MG PO TABS Oral Take 1 tablet by mouth every 4 (four) hours as needed. For pain    . PREGABALIN 50 MG PO CAPS Oral Take 50 mg by mouth 2 (two) times daily. increase to 1 three times a day as directed.    Marland Kitchen RAMIPRIL 10 MG PO CAPS Oral Take 10 mg by mouth daily.      BP 126/70  Pulse 72  Temp(Src) 98.2 F (36.8 C)  (Oral)  Resp 16  SpO2 99%  Physical Exam  Nursing note and vitals reviewed. Constitutional: He is oriented to person, place, and time. He appears well-developed and well-nourished. Distressed: in moderate distress with left lateral chest pain.  HENT:  Head: Normocephalic and atraumatic.  Right Ear: External ear normal.  Left Ear: External ear normal.  Mouth/Throat: Oropharynx is clear and moist.  Eyes: Conjunctivae and EOM are normal. Pupils are equal, round, and reactive to light.  Neck: Normal range of motion. Neck supple.  Cardiovascular: Normal rate, regular rhythm and normal heart sounds.   Pulmonary/Chest: Effort normal and breath sounds normal.  Abdominal: Soft. Bowel sounds are normal.  Musculoskeletal: Normal range of motion.  Neurological: He is alert and oriented to person, place, and time.       Sensory or motor deficit.  Skin: Skin is warm and dry.  Psychiatric: He has a normal mood and affect. His behavior is normal.    ED Course  CRITICAL CARE Performed by: Osvaldo Human Authorized by: Osvaldo Human Total critical care time: 30 minutes Critical care was necessary to treat or prevent imminent or life-threatening deterioration of the following conditions: Evaluation of chest pain with history of coronary artery disease and unstable angina. Critical care was time spent personally by me on the following activities: development of treatment plan with patient or surrogate, discussions with consultants, evaluation of patient's response to treatment, examination of patient, obtaining history from patient or surrogate, ordering and review of laboratory studies, ordering and performing treatments and interventions, ordering and review of radiographic studies, re-evaluation of patient's condition and review of old charts.   (including critical care time)  5:39 PM  Date: 05/13/2011  Rate: 74  Rhythm: normal sinus rhythm  QRS Axis: normal  Intervals: PR  prolonged PQRS:  Left atrial abnormality.  ST/T Wave abnormalities: normal  Conduction Disutrbances:first-degree A-V block   Narrative Interpretation: Borderline EKG  Old EKG Reviewed: unchanged  7:36 PM   Results for orders placed during the hospital encounter of 05/13/11  PRO B NATRIURETIC PEPTIDE      Component Value Range   Pro B Natriuretic peptide (BNP) 109.6  0 - 125 (pg/mL)  CBC      Component Value Range   WBC 10.0  4.0 - 10.5 (K/uL)   RBC 4.83  4.22 - 5.81 (MIL/uL)   Hemoglobin 15.0  13.0 - 17.0 (g/dL)   HCT 10.2  72.5 - 36.6 (%)   MCV 90.3  78.0 - 100.0 (fL)   MCH 31.1  26.0 - 34.0 (pg)   MCHC 34.4  30.0 - 36.0 (g/dL)   RDW 04.5  40.9 - 81.1 (%)   Platelets 167  150 - 400 (K/uL)  COMPREHENSIVE METABOLIC PANEL      Component Value Range   Sodium 135  135 - 145 (mEq/L)   Potassium 3.2 (*) 3.5 - 5.1 (mEq/L)   Chloride 96  96 - 112 (mEq/L)   CO2 24  19 - 32 (mEq/L)   Glucose, Bld 109 (*) 70 - 99 (mg/dL)   BUN 31 (*) 6 - 23 (mg/dL)   Creatinine, Ser 9.14  0.50 - 1.35 (mg/dL)   Calcium 9.1  8.4 - 78.2 (mg/dL)   Total Protein 6.9  6.0 - 8.3 (g/dL)   Albumin 3.6  3.5 - 5.2 (g/dL)   AST 26  0 - 37 (U/L)   ALT 47  0 - 53 (U/L)   Alkaline Phosphatase 59  39 - 117 (U/L)   Total Bilirubin 0.4  0.3 - 1.2 (mg/dL)   GFR calc non Af Amer 87 (*) >90 (mL/min)   GFR calc Af Amer >90  >90 (mL/min)  PROTIME-INR      Component Value Range   Prothrombin Time 12.2  11.6 - 15.2 (seconds)   INR 0.89  0.00 - 1.49   APTT      Component Value Range   aPTT 27  24 - 37 (seconds)  URINALYSIS, ROUTINE W REFLEX MICROSCOPIC      Component Value Range   Color, Urine YELLOW  YELLOW    APPearance HAZY (*) CLEAR    Specific Gravity, Urine 1.014  1.005 - 1.030    pH 6.0  5.0 - 8.0    Glucose, UA NEGATIVE  NEGATIVE (mg/dL)   Hgb urine dipstick NEGATIVE  NEGATIVE    Bilirubin Urine NEGATIVE  NEGATIVE    Ketones, ur NEGATIVE  NEGATIVE (mg/dL)   Protein, ur NEGATIVE  NEGATIVE (mg/dL)    Urobilinogen, UA 0.2  0.0 - 1.0 (mg/dL)   Nitrite NEGATIVE  NEGATIVE    Leukocytes, UA NEGATIVE  NEGATIVE   POCT I-STAT, CHEM 8      Component Value Range   Sodium 137  135 - 145 (mEq/L)   Potassium 3.1 (*) 3.5 - 5.1 (mEq/L)   Chloride 102  96 - 112 (mEq/L)   BUN 33 (*) 6 - 23 (mg/dL)   Creatinine, Ser 9.56  0.50 - 1.35 (mg/dL)   Glucose, Bld 213 (*) 70 - 99 (mg/dL)   Calcium, Ion 0.86  5.78 - 1.32 (mmol/L)   TCO2 27  0 - 100 (mmol/L)   Hemoglobin 15.6  13.0 - 17.0 (g/dL)   HCT 46.9  62.9 - 52.8 (%)  CBC      Component Value Range   WBC 10.2  4.0 - 10.5 (K/uL)   RBC 4.74  4.22 - 5.81 (MIL/uL)   Hemoglobin 15.0  13.0 - 17.0 (g/dL)   HCT 41.3  24.4 - 01.0 (%)   MCV 89.9  78.0 - 100.0 (fL)   MCH 31.6  26.0 - 34.0 (pg)   MCHC 35.2  30.0 - 36.0 (g/dL)   RDW 27.2  53.6 - 64.4 (%)   Platelets 160  150 - 400 (K/uL)  POCT I-STAT TROPONIN I      Component Value Range   Troponin i, poc 0.00  0.00 - 0.08 (ng/mL)   Comment 3            Dg Chest Portable 1 View  05/13/2011  *RADIOLOGY REPORT*  Clinical Data: Chest pain  PORTABLE CHEST - 1 VIEW  Comparison: 11/17/2010  Findings: Cardiomediastinal silhouette is stable.  Status post CABG.  No acute infiltrate or pleural effusion.  No pulmonary edema.  Bony thorax is stable.  IMPRESSION: No active disease.  Status post CABG.  Original Report Authenticated By: Natasha Mead, M.D.    Lab tests were reassuringly normal. Patient is symptomatically improved though still having some degree of pain in his left chest following treatment with IV nitroglycerin and IV heparin. Bureau cardiology to be called.   7:49 PM [facility] to see pt.   1. Chest pain   2. Unstable angina             Carleene Cooper III, MD 05/13/11 1951  Carleene Cooper III, MD 05/13/11 2016  Carleene Cooper III, MD 06/18/11 [phone]

## 2011-05-13 NOTE — H&P (Signed)
NAMEMARIANO, Tommy Morse NO.:  [id number]  MEDICAL RECORD NO.:  [id number]  LOCATION:  3316                         FACILITY:  [facility]  PHYSICIAN:  Natasha Bence, MD       DATE OF BIRTH:  07-26-1950  DATE OF ADMISSION:  05/13/2011 DATE OF DISCHARGE:                             HISTORY & PHYSICAL   CHIEF COMPLAINT:  Chest discomfort.  PRIMARY CARDIOLOGIST:  Colleen Can. Deborah Chalk, MD.  He is now seeing Dr. Johney Frame.  PRIMARY CARE PRACTITIONER:  Avelino Leeds, MD  HISTORY OF PRESENT ILLNESS:  Mr. Pursel is a 61 year old white male with history of coronary artery disease status post coronary artery bypass grafting x6 in 2008, who presents with chest discomfort.  He says he has had 2-3 episodes today of what he describes as a gnawing chest pain located in left chest and into his left axillary region.  This chest discomfort lasts for several minutes to hours at a time.  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  He laid down and took a nap and later had the chest discomfort again.  This pain increased in severity and so he sought medical attention.  He does not have any shortness of breath.  He has not had any increase in dyspnea on exertion.  He has never had a history of chest discomfort before, but before his previous bypass surgery, he had mainly dyspnea on exertion.  The pain is not related to movement. He reports he did get relief with nitroglycerin and significantly more relieved after the nitroglycerin infusion was started.  He was also started on a heparin infusion in the ED by the ER physicians. Currently, his chest pain is almost completely resolved.  He has no lightheadedness or dizziness.  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  He has no PND or orthopnea. No palpitations, lightheadedness,  or syncope.  Otherwise, he denies any fevers, chills, or sweats.  He has no trouble with bleeding or melena. A complete review of systems was performed was otherwise negative except for as stated above.  PAST MEDICAL HISTORY: 1. Coronary artery bypass grafting x6.  He has a LIMA to the LAD, a     reverse saphenous vein graft to a diagonal, a sequential reverse     saphenous vein graft to the second and third obtuse marginal, a     sequential reverse saphenous vein graft to the posterior descending     and a continuation branch of the right coronary in 2008. 2. He also has a history of left ventricular hypertrophy with     diastolic dysfunction. 3. Hypertension. 4. Diabetes. 5. Dyslipidemia. 6. Previous right lateral medullary infarct. 7. Peripheral neuropathy.  SURGICAL HISTORY:  He has had: 1. CABG. 2. Back surgery.  FAMILY HISTORY:  Both of his parents had heart disease.  SOCIAL HISTORY:  He is a Programmer, multimedia.  He denies any alcohol or illicit drug use.  ALLERGIES:  He is allergic to AMIODARONE and AUGMENTIN and STATINS.  He reports diffuse myalgias with statins.  HOME MEDICATIONS: 1. Plavix 75 mg p.o. daily. 2. Zetia 10 mg p.o. daily. 3. Glucotrol 5 mg p.o. daily. 4. Hydrochlorothiazide 25 mg p.o. daily. 5. Metformin 1000 mg p.o. b.i.d. 6. Metoprolol 50 mg p.o. b.i.d. 7. Lovaza 2 g p.o. daily. 8. Percocet p.r.n. 9. Lyrica 50 mg p.o. b.i.d. 10.Altace 10 mg p.o. daily.  PHYSICAL EXAMINATION:  VITAL SIGNS:  He is afebrile.  Temperature 98.2, pulse is 73 and regular, respiratory rate of 15, blood pressure 118/86, O2 sats 98% on room air. GENERAL:  He is a well developed, well nourished white male, in no apparent distress.  HEENT:  Eyes:  He has anicteric sclerae.  Mucous membranes are moist. NECK:  Normal jugular venous pressure.  No carotid bruits. LUNGS:  Clear to auscultation bilaterally. CARDIOVASCULAR:  He has regular rate and rhythm.  No murmurs, rubs, or gallops.   ABDOMEN:  Soft, nontender, nondistended. EXTREMITIES:  Warm with no edema.  He has symmetrical pulses throughout. NEURO:  Grossly afocal. SKIN:  No rashes or ulcers.  LABORATORY DATA:  Sodium 137, potassium 3.1, chloride of 102, bicarb of 24, BUN of 31, creatinine is 0.99, calcium of 9.1, glucose of 109. Liver enzymes within normal limits.  ProBNP is 109.1.  Point of care troponin was 0.0.  White blood cell count was 10.0, hematocrit was 44, platelet count was 167.  Urinalysis was normal.  Chest x-ray showed no acute infiltrates or effusion.  EKG shows sinus mechanism rate of 74 beats per minute.  He has nonspecific ST changes.  IMPRESSION AND PLAN:  This is a 61 year old male with known coronary artery disease, hypertension, diabetes, who presents with chest discomfort.  His symptoms are a little vague in nature.  However, given his history, we will admit him to rule out myocardial infarction.  He was started on heparin infusion nitroglycerin in the ED by the ER physicians.  Currently, he is chest pain-free.  We will attempt to wean him off his nitroglycerin infusion to see how he does.  His first troponin was negative.  His EKG has no acute ischemic changes.  We will check serial cardiac enzymes and place him on telemetry.  We will continue his current home medication regimen as is.  We will keep him n.p.o. just in case we decide to proceed with stress testing in the morning if he rules out for myocardial infarction.          ______________________________ Natasha Bence, MD     MH/MEDQ  D:  05/13/2011  T:  05/13/2011  Job:  [id number]

## 2011-05-14 ENCOUNTER — Inpatient Hospital Stay (HOSPITAL_COMMUNITY): Payer: BC Managed Care – PPO

## 2011-05-14 ENCOUNTER — Encounter (HOSPITAL_COMMUNITY): Payer: Self-pay | Admitting: Cardiology

## 2011-05-14 DIAGNOSIS — R079 Chest pain, unspecified: Secondary | ICD-10-CM

## 2011-05-14 LAB — GLUCOSE, CAPILLARY: Glucose-Capillary: 136 mg/dL — ABNORMAL HIGH (ref 70–99)

## 2011-05-14 LAB — CBC
HCT: 41.3 % (ref 39.0–52.0)
MCV: 90.8 fL (ref 78.0–100.0)
Platelets: 137 10*3/uL — ABNORMAL LOW (ref 150–400)
RBC: 4.55 MIL/uL (ref 4.22–5.81)
RDW: 14.7 % (ref 11.5–15.5)
WBC: 7.6 10*3/uL (ref 4.0–10.5)

## 2011-05-14 LAB — CARDIAC PANEL(CRET KIN+CKTOT+MB+TROPI)
Relative Index: INVALID (ref 0.0–2.5)
Total CK: 72 U/L (ref 7–232)

## 2011-05-14 LAB — HEMOGLOBIN A1C: Hgb A1c MFr Bld: 7 % — ABNORMAL HIGH (ref <5.7)

## 2011-05-14 LAB — BASIC METABOLIC PANEL
CO2: 24 mEq/L (ref 19–32)
Chloride: 102 mEq/L (ref 96–112)
Creatinine, Ser: 0.83 mg/dL (ref 0.50–1.35)
GFR calc Af Amer: >90 mL/min (ref >90)
Sodium: 138 mEq/L (ref 135–145)

## 2011-05-14 LAB — LIPID PANEL
Cholesterol: 243 mg/dL — ABNORMAL HIGH (ref 0–200)
Triglycerides: 741 mg/dL — ABNORMAL HIGH (ref <150)
VLDL: UNDETERMINED mg/dL (ref 0–40)

## 2011-05-14 LAB — HEPARIN LEVEL (UNFRACTIONATED): Heparin Unfractionated: 0.29 IU/mL — ABNORMAL LOW (ref 0.30–0.70)

## 2011-05-14 MED ORDER — REGADENOSON 0.4 MG/5ML IV SOLN
0.4000 mg | Freq: Once | INTRAVENOUS | Status: AC
  Administered 2011-05-14: 0.4 mg via INTRAVENOUS
  Filled 2011-05-14: qty 5

## 2011-05-14 MED ORDER — GEMFIBROZIL 600 MG PO TABS: 600.0000 mg | ORAL_TABLET | Freq: Two times a day (BID) | ORAL | Status: DC

## 2011-05-14 MED ORDER — NITROGLYCERIN 0.4 MG SL SUBL: 0.4000 mg | SUBLINGUAL_TABLET | SUBLINGUAL | Status: DC | PRN

## 2011-05-14 MED ORDER — ONDANSETRON HCL 4 MG/2ML IJ SOLN
INTRAMUSCULAR | Status: AC
  Administered 2011-05-14: 4 mg via INTRAVENOUS
  Filled 2011-05-14: qty 2

## 2011-05-14 MED ORDER — TECHNETIUM TC 99M TETROFOSMIN IV KIT
10.0000 | PACK | Freq: Once | INTRAVENOUS | Status: AC | PRN
  Administered 2011-05-14: 10 via INTRAVENOUS

## 2011-05-14 MED ORDER — TECHNETIUM TC 99M TETROFOSMIN IV KIT
30.0000 | PACK | Freq: Once | INTRAVENOUS | Status: AC | PRN
  Administered 2011-05-14: 30 via INTRAVENOUS

## 2011-05-14 MED ORDER — POTASSIUM CHLORIDE CRYS ER 20 MEQ PO TBCR
40.0000 meq | EXTENDED_RELEASE_TABLET | Freq: Once | ORAL | Status: AC
  Administered 2011-05-14: 40 meq via ORAL
  Filled 2011-05-14: qty 2

## 2011-05-14 MED ORDER — ASPIRIN 81 MG PO TBEC: 81.0000 mg | DELAYED_RELEASE_TABLET | Freq: Every day | ORAL | Status: DC

## 2011-05-14 NOTE — Progress Notes (Signed)
Discharge instructions given to pt and spouse.  Both verbalized understanding with all questions answered.  Pt discharged home with wife.  Lois Slagel, RN 

## 2011-05-14 NOTE — Discharge Summary (Signed)
Discharge Summary   Patient ID: Tommy Morse MRN: [id number], DOB/AGE: [birth year] 62 y.o.  Primary MD: Avelino Leeds, MD Primary Cardiologist: Hillis Range MD  Admit date: 05/13/2011 D/C date:     05/14/2011      Primary Discharge Diagnoses:  1. Chest pain, noncardiac, likely musculoskeletal  - negative cardiac enzymes, no acute EKG changes   - Lexiscan myoview 05/14/11 no inducible ischemia, EF 68% 2. Hyperlipidemia  - H/o intolerance to statins  - Triglycerides 741 this admission  - Initiated on Gemfibrozil  - Outpatient f/u w/ lipid clinic  Secondary Discharge Diagnoses:  1. CAD s/p 6V CABG '08 - LIMA to the LAD, reverse saphenous vein graft to a diagonal, a sequential reverse saphenous vein graft to the second and third obtuse marginal, a sequential reverse saphenous vein graft to the posterior descending and a continuation branch of the right coronary 2. Diastolic CHF 3. HTN 4. DM - A1C 7.0 5. Right lateral medullary infarct 6. Peripheral neuropathy 7. Back surgery  Allergies Allergies  Allergen Reactions  . Amiodarone Other (See Comments)    "Made me Dr. Domenick Gong and [name]."  . Augmentin Rash  . Statins Other (See Comments)    "Puts me in a funk."    Diagnostic Studies/Procedures:   05/14/2011 - Lexiscan Myoview Findings:  Rest images demonstrate fixed defect involving the mid to basilar segments of the inferior/inferolateral wall.  Stress images demonstrate no areas of reversibility.  Evaluation of wall motion demonstrates normal left ventricular wall motion and thickening.  Ejection fraction is estimated at 68%.  End diastolic volume of 81 cc.  End systolic volume of  26 cc.  IMPRESSION:  1.  No areas of reversibility to suggest inducible ischemia. 2.  Apparent fixed defect involving the mid to basilar segments of the inferior/inferolateral wall.  Favor diaphragmatic attenuation. Scar could look similar. 3.  Normal ejection fraction, estimated at 68%.     05/13/2011 - CXR Findings: Cardiomediastinal silhouette is stable.  Status post CABG.  No acute infiltrate or pleural effusion.  No pulmonary edema.  Bony thorax is stable.  IMPRESSION: No active disease.  Status post CABG.      History of Present Illness: 61 y.o. male w/ PMHx significant for CAD s/p CABG '08 who presented to [facility] on 05/13/11 with complaints of chest pain.  He reported 2-3 episodes of chest discomfort on the day of presentation that he described as a gnawing chest pain located in left chest and into his left axillary region. This chest discomfort lasted for several minutes to hours at a time. He reported feeling somewhat weak and sweaty. The pain increased in severity and so he sought medical attention. He has never had a history of chest discomfort before, but before his previous bypass surgery, he had mainly dyspnea on exertion. He did get relief with nitroglycerin  Hospital Course: In the ED, EKG revealed NSR with nonspecific ST changes, CXR was without acute cardiopulmonary abnormalities and initial poc troponin negative. He was initiated on Heparin and NTG with relief of his pain. Due to his cardiac history and risk factors he was admitted for further evaluation and treatment.  Cardiac enzymes were cycled and remained negative. A lexiscan myoview was performed on 05/14/11 that revealed no areas of reversibility to suggest inducible ischemia, fixed defect involving mid to basilar segments of the inferior/inferolateral wall (?diagphragmatic attenuation), and EF 68%. His triglycerides were significantly elevated at 741 for which he was initiated on Gemfibrozil and  referred to the lipid clinic.  He was seen and evaluated by Dr. Johney Frame who felt he was stable for discharge home with plans for follow up as scheduled below.  Discharge Vitals: Blood pressure 153/90, pulse 86, temperature 97.7 F (36.5 C), temperature source Oral, resp. rate 15, height 6\' 1"  (1.854 m), weight  202 lb 2.6 oz (91.7 kg), SpO2 97.00%.  Labs: Component Value Date   WBC 10.2 05/13/2011   HGB 15.0 05/13/2011   HCT 42.6 05/13/2011   MCV 89.9 05/13/2011   PLT 160 05/13/2011    Lab 05/13/11 1835 05/13/11 1801  NA 137 --  K 3.1* --  CL 102 --  CO2 -- 24  BUN 33* --  CREATININE 1.10 --  CALCIUM -- 9.1  PROT -- 6.9  BILITOT -- 0.4  ALKPHOS -- 59  ALT -- 47  AST -- 26  GLUCOSE 108* --    11/17/2010 12:07  Troponin i, poc 0.00   Basename 05/13/11 2352 05/13/11 2151  CKTOTAL 72 86  CKMB 2.4 2.4  TROPONINI <0.30 <0.30     05/13/2011 18:00  Pro B Natriuretic peptide (BNP) 109.6   Lab Results  Component Value Date   CHOL 243* 05/14/2011   HDL 36* 05/14/2011   LDLCALC UNABLE TO CALCULATE IF TRIGLYCERIDE OVER 400 mg/dL 1/61/0960   TRIG 454* 0/98/1191     05/13/2011 21:51  Hemoglobin A1C 7.0 (H)    Discharge Medications   Medication List  As of 05/14/2011  3:33 PM   STOP taking these medications         gabapentin 300 MG capsule         TAKE these medications         aspirin 81 MG EC tablet   Take 1 tablet (81 mg total) by mouth daily.      clopidogrel 75 MG tablet   Commonly known as: PLAVIX   Take 75 mg by mouth daily.      ezetimibe 10 MG tablet   Commonly known as: ZETIA   Take 10 mg by mouth daily.      gemfibrozil 600 MG tablet   Commonly known as: LOPID   Take 1 tablet (600 mg total) by mouth 2 (two) times daily before a meal.      glipiZIDE 5 MG tablet   Commonly known as: GLUCOTROL   Take 5 mg by mouth daily.      hydrochlorothiazide 25 MG tablet   Commonly known as: HYDRODIURIL   Take 25 mg by mouth daily.      metFORMIN 500 MG tablet   Commonly known as: GLUCOPHAGE   Take 1,000 mg by mouth 2 (two) times daily with a meal.      metoprolol 50 MG tablet   Commonly known as: LOPRESSOR   Take 50 mg by mouth 2 (two) times daily.      nitroGLYCERIN 0.4 MG SL tablet   Commonly known as: NITROSTAT   Place 1 tablet (0.4 mg total) under the tongue  every 5 (five) minutes x 3 doses as needed for chest pain (up to 3 doses).      omega-3 acid ethyl esters 1 G capsule   Commonly known as: LOVAZA   Take 2 g by mouth daily.      oxyCODONE-acetaminophen 10-325 MG per tablet   Commonly known as: PERCOCET   Take 1 tablet by mouth every 4 (four) hours as needed. For pain      pregabalin 50 MG capsule  Commonly known as: LYRICA   Take 50 mg by mouth 2 (two) times daily. increase to 1 three times a day as directed.      ramipril 10 MG capsule   Commonly known as: ALTACE   Take 10 mg by mouth daily.            Disposition   Discharge Orders    Future Appointments: Provider: Department: Dept Phone: Center:   05/16/2011 2:30 PM [facility] [phone] None   05/24/2011 2:15 PM Laurey Morale, MD [facility] [place] [phone] [facility]   06/05/2011 10:00 AM Milas Gain, MD [facility] [place] [phone] None   06/20/2011 11:30 AM Hillis Range, MD [facility] [phone] [facility]     Future Orders Please Complete By Expires   Diet - low sodium heart healthy      Increase activity slowly      Discharge instructions      Comments:   **PLEASE REMEMBER TO BRING ALL OF YOUR MEDICATIONS TO EACH OF YOUR FOLLOW-UP OFFICE VISITS.      Follow-up Information    Follow up with Avelino Leeds, MD. Schedule an appointment as soon as possible for a visit in 1 month.      Follow up with [facility] on 05/16/2011. (2:30)    Contact information:   [facility] [address] [place] [address] [phone]      Follow up with Hillis Range, MD on 06/20/2011. (11:30)    Contact information:   [facility] [address] [place], [address] [place] [address] [phone]           Outstanding Labs/Studies:  None  Duration of Discharge Encounter: Greater than 30 minutes including physician and PA  time.  Signed, HOPE, JESSICA PA-C 05/14/2011, 3:33 PM I have seen, examined the patient, and reviewed the above assessment and plan.   Co Sign: Hillis Range, MD 05/14/2011 6:21 PM

## 2011-05-14 NOTE — Progress Notes (Signed)
SUBJECTIVE: The patient is doing well today.  His left axillary chest pain is resolved.  At this time, he denies shortness of breath, or any new concerns.     Marland Kitchen aspirin  324 mg Oral NOW   Or  . aspirin  300 mg Rectal NOW  . aspirin EC  81 mg Oral Daily  . clopidogrel  75 mg Oral Daily  . ezetimibe  10 mg Oral Daily  . heparin  4,000 Units Intravenous Once  . hydrochlorothiazide  25 mg Oral Daily  . insulin aspart  0-15 Units Subcutaneous TID WC  . insulin aspart  0-5 Units Subcutaneous QHS  . metoprolol  50 mg Oral BID  .  morphine injection  4 mg Intravenous Once  . omega-3 acid ethyl esters  2 g Oral Daily  . potassium chloride  40 mEq Oral Once  . pregabalin  50 mg Oral BID  . ramipril  10 mg Oral Daily      . sodium chloride 1,000 mL (05/13/11 1957)  . DISCONTD: heparin 1,100 Units/hr (05/14/11 0600)  . DISCONTD: nitroGLYCERIN 5 mcg/min (05/13/11 1856)    OBJECTIVE: Physical Exam: Filed Vitals:   05/13/11 1906 05/13/11 2100 05/14/11 0032 05/14/11 0355  BP: 118/86 110/82 90/50 97/65   Pulse: 73 74 66 68  Temp:  98.7 F (37.1 C) 98 F (36.7 C) 98.2 F (36.8 C)  TempSrc:  Oral Oral Oral  Resp: 15 18 15 14   Height:  6\' 1"  (1.854 m)    Weight:  202 lb 2.6 oz (91.7 kg)    SpO2: 98% 96% 95% 97%    Intake/Output Summary (Last 24 hours) at 05/14/11 0806 Last data filed at 05/14/11 0600  Gross per 24 hour  Intake   1265 ml  Output    975 ml  Net    290 ml    Telemetry reveals sinus rhythm  GEN- The patient is well appearing, sleeping but rouses, oriented x 3 today.   Head- normocephalic, atraumatic Eyes-  Sclera clear, conjunctiva pink Ears- hearing intact Oropharynx- clear Neck- supple, no JVP Lymph- no cervical lymphadenopathy Lungs- Clear to ausculation bilaterally, normal work of breathing Heart- Regular rate and rhythm, no murmurs, rubs or gallops, PMI not laterally displaced GI- soft, NT, ND, + BS Extremities- no clubbing, cyanosis, or edema Skin- no  rash or lesion Psych- euthymic mood, full affect Neuro- strength and sensation are intact  LABS: Basic Metabolic Panel:  Basename 05/13/11 1835 05/13/11 1801  NA 137 135  K 3.1* 3.2*  CL 102 96  CO2 -- 24  GLUCOSE 108* 109*  BUN 33* 31*  CREATININE 1.10 0.99  CALCIUM -- 9.1  MG -- --  PHOS -- --   Liver Function Tests:  Basename 05/13/11 1801  AST 26  ALT 47  ALKPHOS 59  BILITOT 0.4  PROT 6.9  ALBUMIN 3.6   No results found for this basename: LIPASE:2,AMYLASE:2 in the last 72 hours CBC:  Basename 05/13/11 1847 05/13/11 1835 05/13/11 1801  WBC 10.2 -- 10.0  NEUTROABS -- -- --  HGB 15.0 15.6 --  HCT 42.6 46.0 --  MCV 89.9 -- 90.3  PLT 160 -- 167   Cardiac Enzymes:  Basename 05/13/11 2352 05/13/11 2151  CKTOTAL 72 86  CKMB 2.4 2.4  CKMBINDEX -- --  TROPONINI <0.30 <0.30   Hemoglobin A1C:  Basename 05/13/11 2151  HGBA1C 7.0*   Fasting Lipid Panel:  Basename 05/14/11 0545  CHOL 243*  HDL 36*  LDLCALC UNABLE  TO CALCULATE IF TRIGLYCERIDE OVER 400 mg/dL  TRIG 454*  CHOLHDL 6.8  LDLDIRECT --   Thyroid Function Tests: No results found for this basename: TSH,T4TOTAL,FREET3,T3FREE,THYROIDAB in the last 72 hours Anemia Panel: No results found for this basename: VITAMINB12,FOLATE,FERRITIN,TIBC,IRON,RETICCTPCT in the last 72 hours  RADIOLOGY: Dg Chest Portable 1 View  05/13/2011  *RADIOLOGY REPORT*  Clinical Data: Chest pain  PORTABLE CHEST - 1 VIEW  Comparison: 11/17/2010  Findings: Cardiomediastinal silhouette is stable.  Status post CABG.  No acute infiltrate or pleural effusion.  No pulmonary edema.  Bony thorax is stable.  IMPRESSION: No active disease.  Status post CABG.  Original Report Authenticated By: Natasha Mead, M.D.    ASSESSMENT AND PLAN:  Principal Problem:  *Unstable angina pectoris Active Problems:  Hypertension  Hyperlipidemia  LVH (left ventricular hypertrophy)  Coronary artery disease  Diabetes mellitus  1. Chest pain- both typical  and atypical features.  Denies SOB.  I suspect that this may be musculoskeletal in origin. Given his known CAD, however, I think that we should exclude worsening of CAD.  We will therefore plan lexiscan myoview today.  IF low risk, then he could be discharged this afternoon.  If high risk then further CV risk stratification may be required. Continue ASA and plavix, metoprolol Stop heparin gtt  2. HL- significantly elevated triglycerides,  He has not previously tolerated statins Will add gemfibrazil at discharge and have him enrolled in our lipid clinic  3. HTN- stable  4. DM- elevated A1C Will need to follow-up with PCP  Disposition- possibly to home if myoview is low risk later today He should follow-up with Norma Fredrickson in 2-4 weeks post discharge and be enrolled in our lipid clinic.   Hillis Range, MD 05/14/2011 8:06 AM

## 2011-05-14 NOTE — Progress Notes (Signed)
Patient seen briefly in nuclear stress room.   Pre Test: VSS. EKG Sinus rhythm 72bpm, TWI I, aVL and TW flattening V5-6. Pt w/o chest pain or sob. C/o "gassy"  During Test: VSS with moderate increase in BP. EKG TW flattening V4 and TWI V6. Pt c/o chest pain that was different than what brought him in. (+) nausea requiring zofran  Post Test: VSS. EKG returned to baseline. Symptoms resolved.  Await interpretation of images.  Jowanda Heeg PA-C 05/14/2011 10:14 AM

## 2011-05-14 NOTE — Progress Notes (Signed)
ANTICOAGULATION CONSULT NOTE - Follow Up Consult  Pharmacy Consult for heparin Indication: chest pain/ACS  Labs:  Basename 05/13/11 2352 05/13/11 2151 05/13/11 1847 05/13/11 1835 05/13/11 1801  HGB -- -- 15.0 15.6 --  HCT -- -- 42.6 46.0 43.6  PLT -- -- 160 -- 167  APTT -- -- -- -- 27  LABPROT -- -- -- -- 12.2  INR -- -- -- -- 0.89  HEPARINUNFRC 0.29* -- -- -- --  CREATININE -- -- -- 1.10 0.99  CKTOTAL 72 86 -- -- --  CKMB 2.4 2.4 -- -- --  TROPONINI <0.30 <0.30 -- -- --    Assessment: 61yo male slightly subtherapeutic on heparin with initial dosing for CP though level drawn 1hr early and would expect a bit of a bolus effect.  Goal of Therapy:  Heparin level 0.3-0.7 units/ml   Plan:  Will increase heparin gtt by ~1 unit/kg/hr to 1100 units/hr and check level in 6hr.  Colleen Can PharmD BCPS 05/14/2011,1:40 AM

## 2011-05-16 ENCOUNTER — Ambulatory Visit: Payer: BC Managed Care – PPO

## 2011-05-21 ENCOUNTER — Other Ambulatory Visit: Payer: Self-pay | Admitting: Neurology

## 2011-05-21 ENCOUNTER — Ambulatory Visit (INDEPENDENT_AMBULATORY_CARE_PROVIDER_SITE_OTHER): Payer: BC Managed Care – PPO | Admitting: Neurology

## 2011-05-21 DIAGNOSIS — M5430 Sciatica, unspecified side: Secondary | ICD-10-CM

## 2011-05-21 DIAGNOSIS — M5432 Sciatica, left side: Secondary | ICD-10-CM

## 2011-05-21 MED ORDER — PREGABALIN 100 MG PO CAPS: 100.0000 mg | ORAL_CAPSULE | Freq: Three times a day (TID) | ORAL | Status: DC

## 2011-05-21 NOTE — Progress Notes (Signed)
Dear Dr. Rosiland Oz,  I saw  JADIEL SCHMIEDER back in [facility] for his problem with left sciatic leg pain.  As you may recall, he is a 61 y.o. year old male with a history of right lateral medullary infarction and a previous L4 laminectomy who has had worsening of his back and leg pain over the last several weeks.  At his last appointment I had started him on pregabalin 50 tid, but did not feel it helped him.  He has had extensive investigation by [facility] including a CT lumbar myelogram and EMG/NCS.  I did an MRI L-spine that did not show an obvious cause of his sciatic pain.  He says the pain is worse with movement.  It is burning and shoots to the base of the foot.  I called in emergent vicodin which has helped somewhat.  You tried steroids and Demerol which had little effect.  He is currently not able to work because of the pain.    Medical history, social history, and family history were reviewed and have not changed since the last clinic visit.  Current Outpatient Prescriptions on File Prior to Visit  Medication Sig Dispense Refill  . aspirin EC 81 MG EC tablet Take 1 tablet (81 mg total) by mouth daily.      . clopidogrel (PLAVIX) 75 MG tablet Take 75 mg by mouth daily.        Marland Kitchen ezetimibe (ZETIA) 10 MG tablet Take 10 mg by mouth daily.      Marland Kitchen gemfibrozil (LOPID) 600 MG tablet Take 1 tablet (600 mg total) by mouth 2 (two) times daily before a meal.  60 tablet  3  . glipiZIDE (GLUCOTROL) 5 MG tablet Take 5 mg by mouth daily.       . hydrochlorothiazide (HYDRODIURIL) 25 MG tablet Take 25 mg by mouth daily.      . metFORMIN (GLUCOPHAGE) 500 MG tablet Take 1,000 mg by mouth 2 (two) times daily with a meal.      . metoprolol (LOPRESSOR) 50 MG tablet Take 50 mg by mouth 2 (two) times daily.      . nitroGLYCERIN (NITROSTAT) 0.4 MG SL tablet Place 1 tablet (0.4 mg total) under the tongue every 5 (five) minutes x 3 doses as needed for chest pain (up to 3 doses).  25 tablet  3  .  omega-3 acid ethyl esters (LOVAZA) 1 G capsule Take 2 g by mouth daily.       Marland Kitchen oxyCODONE-acetaminophen (PERCOCET) 10-325 MG per tablet Take 1 tablet by mouth every 4 (four) hours as needed. For pain      . ramipril (ALTACE) 10 MG capsule Take 10 mg by mouth daily.        Allergies  Allergen Reactions  . Amiodarone Other (See Comments)    "Made me Dr. Domenick Gong and [name]."  . Augmentin Rash  . Statins Other (See Comments)    "Puts me in a funk."    ROS:  13 systems were reviewed and are notable for right facial dysesthesia after his stroke.  All other review of systems are unremarkable.  Exam: .There were no vitals filed for this visit.  In general, tired appearing man.  Motor:  Normal bulk and tone, no drift and 5/5 muscle strength bilaterally.  Reflexes:  2+ thoughout, except absent at ankles.  Coordination:  Normal finger to nose  Gait:  Antalgic gait and station.    Impression/Recommendations: Refractory sciatic like pain.  I  have increased his pregabalin to 100 tid.  I have encouraged him to go back and see Dr. Ethelene Hal for a retrial of the ESI if Dr. Ethelene Hal thought it was appropriate.  I certainly don't have a great explanation for his pain, but certainly there doesn't seem to be an obvious structural lesion.  We do have to consider whether he has a possible diabetic plexopathy, as the patient has lost some weight, but he doesn't seem to have any focal weakness.  However, if he does develop weakness of his left leg then this would be a more possible diagnosis.  He will call me if his pain continues to be unrelieved and I will consider further investigations if this makes sense.  Lupita Raider Modesto Charon, MD [facility], [place]

## 2011-05-24 ENCOUNTER — Ambulatory Visit: Payer: BC Managed Care – PPO | Admitting: Cardiology

## 2011-06-05 ENCOUNTER — Ambulatory Visit: Payer: BC Managed Care – PPO | Admitting: Neurology

## 2011-06-11 ENCOUNTER — Other Ambulatory Visit: Payer: Self-pay | Admitting: Nurse Practitioner

## 2011-06-20 ENCOUNTER — Ambulatory Visit (INDEPENDENT_AMBULATORY_CARE_PROVIDER_SITE_OTHER): Payer: BC Managed Care – PPO | Admitting: Internal Medicine

## 2011-06-20 ENCOUNTER — Encounter: Payer: Self-pay | Admitting: Internal Medicine

## 2011-06-20 VITALS — BP 130/70 | HR 74 | Resp 18 | Ht 73.0 in | Wt 201.8 lb

## 2011-06-20 DIAGNOSIS — I2510 Atherosclerotic heart disease of native coronary artery without angina pectoris: Secondary | ICD-10-CM

## 2011-06-20 DIAGNOSIS — E785 Hyperlipidemia, unspecified: Secondary | ICD-10-CM

## 2011-06-20 DIAGNOSIS — E7800 Pure hypercholesterolemia, unspecified: Secondary | ICD-10-CM

## 2011-06-20 DIAGNOSIS — I10 Essential (primary) hypertension: Secondary | ICD-10-CM

## 2011-06-20 NOTE — Assessment & Plan Note (Signed)
Gemfibrozil recently added lfts and lipids in 4 weeks  Consider follow-up in lipid clinic if abnormal.  He has not previously tolerated statins.

## 2011-06-20 NOTE — Patient Instructions (Signed)
Your physician wants you to follow-up in: 6 months with Sunday Spillers and 12 months with Dr Jacquiline Doe will receive a reminder letter in the mail two months in advance. If you don't receive a letter, please call our office to schedule the follow-up appointment.  Your physician recommends that you return for lab work in: 4 weeks Fasting lipid and liver

## 2011-06-20 NOTE — Assessment & Plan Note (Signed)
Stable No change required today  

## 2011-06-20 NOTE — Progress Notes (Signed)
PCP:  Avelino Leeds, MD, MD  The patient presents today for routine cardiology followup.  Since his recent hospitalization for atypical chest pain, the patient reports doing very well.  His primary limitation is from sciatica.  He remains active as Education officer, environmental for [employer].  Today, he denies symptoms of palpitations, chest pain, shortness of breath or other concerns.  Past Medical History  Diagnosis Date  . Hypertension   . Hyperlipidemia     intolerance to statins  . LVH (left ventricular hypertrophy)   . Hypertriglyceridemia     Triglycerides 741 March '13 - Initiated on Gemfibrozil  . Diabetes mellitus     A1C 7.0 March '13  . Coronary artery disease     CABG - x6 - 2008, - Lexiscan myoview 05/14/11 no inducible ischemia, EF 68%  . 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 Neuropathy    Past Surgical History  Procedure Date  . Cardiac catheterization 04/19/2206    EF 60-65%  . 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 Laminectomy 06/2009    L3-L4    Current Outpatient Prescriptions  Medication Sig Dispense Refill  . aspirin EC 81 MG EC tablet Take 1 tablet (81 mg total) by mouth daily.      . clopidogrel (PLAVIX) 75 MG tablet Take 75 mg by mouth daily.        Marland Kitchen ezetimibe (ZETIA) 10 MG tablet Take 10 mg by mouth daily.      Marland Kitchen gemfibrozil (LOPID) 600 MG tablet Take 1 tablet (600 mg total) by mouth 2 (two) times daily before a meal.  60 tablet  3  . glipiZIDE (GLUCOTROL) 5 MG tablet Take 5 mg by mouth daily.       . hydrochlorothiazide (HYDRODIURIL) 25 MG tablet Take 25 mg by mouth daily.      . metFORMIN (GLUCOPHAGE) 500 MG tablet Take 1,000 mg by mouth 2 (two) times daily with a meal.      . metoprolol (LOPRESSOR) 50 MG tablet Take 50 mg by mouth 2 (two) times daily.      . ramipril (ALTACE) 10 MG capsule Take 10 mg by mouth daily.      . hydrochlorothiazide (HYDRODIURIL) 25 MG tablet TAKE 1 TABLET BY MOUTH EVERY DAY  30  tablet  0  . nitroGLYCERIN (NITROSTAT) 0.4 MG SL tablet Place 1 tablet (0.4 mg total) under the tongue every 5 (five) minutes x 3 doses as needed for chest pain (up to 3 doses).  25 tablet  3  . omega-3 acid ethyl esters (LOVAZA) 1 G capsule Take 2 g by mouth daily.       Marland Kitchen oxyCODONE-acetaminophen (PERCOCET) 10-325 MG per tablet Take 1 tablet by mouth every 4 (four) hours as needed. For pain      . pregabalin (LYRICA) 100 MG capsule Take 1 capsule (100 mg total) by mouth 3 (three) times daily. increase to 1 three times a day as directed.  90 capsule  3  . DISCONTD: gabapentin (NEURONTIN) 300 MG capsule 600 mg. increase to 1 tab three times a day as directed        Allergies  Allergen Reactions  . Amiodarone Other (See Comments)    "Made me Dr. Domenick Gong and [name]."  . Augmentin Rash  . Statins Other (See Comments)    "Puts me in a funk."    History   Social History  .  Marital Status: Married    Spouse Name: N/A    Number of Children: N/A  . Years of Education: N/A   Occupational History  . Not on file.   Social History Main Topics  . Smoking status: Never Smoker   . Smokeless tobacco: Never Used  . Alcohol Use: No  . Drug Use: No  . Sexually Active: Not on file   Other Topics Concern  . Not on file   Social History Narrative  . No narrative on file    Family History  Problem Relation Age of Onset  . Heart disease Mother     valvular heart disease / Strong Family History  . Heart attack Father   . Heart attack Mother   . Hypertension Brother     Physical Exam: Filed Vitals:   06/20/11 1144  BP: 130/70  Pulse: 74  Resp: 18  Height: 6\' 1"  (1.854 m)  Weight: 201 lb 12.8 oz (91.536 kg)    GEN- The patient is well appearing, alert and oriented x 3 today.   Head- normocephalic, atraumatic Eyes-  Sclera clear, conjunctiva pink Ears- hearing intact Oropharynx- clear Neck- supple, no JVP Lymph- no cervical lymphadenopathy Lungs- Clear to ausculation  bilaterally, normal work of breathing Heart- Regular rate and rhythm, no murmurs, rubs or gallops, PMI not laterally displaced GI- soft, NT, ND, + BS Extremities- no clubbing, cyanosis, or edema    Assessment and Plan:

## 2011-06-20 NOTE — Assessment & Plan Note (Signed)
Stable. No changes today. 

## 2011-07-05 ENCOUNTER — Telehealth: Payer: Self-pay | Admitting: Neurology

## 2011-07-05 NOTE — Telephone Encounter (Signed)
Dr. Modesto Charon, reviewed the last office note but not sure about the "nerve specialist" that was mentioned in the message. Please advise. Thank you.

## 2011-07-05 NOTE — Telephone Encounter (Signed)
Pt states that injections from Dr. Ethelene Hal are not helping his pain level. He is wondering if he should go see the "nerve specialist" that Dr. Modesto Charon mentioned during his last appointment. Please call back and advise patient on next steps.

## 2011-07-06 ENCOUNTER — Other Ambulatory Visit: Payer: Self-pay | Admitting: Neurology

## 2011-07-06 DIAGNOSIS — M79606 Pain in leg, unspecified: Secondary | ICD-10-CM

## 2011-07-06 NOTE — Telephone Encounter (Signed)
Spoke with the patient. Aware of NCV/EMG appointment on 08/13/11 at 0915 at [facility] in [place]. No additional questions or concerns at this time.

## 2011-07-06 NOTE — Telephone Encounter (Signed)
Yes set him up to see Dr. Anne Hahn for an EMG/NCS of his left lower extremity - ? diabetic plexopathy.

## 2011-07-12 ENCOUNTER — Telehealth: Payer: Self-pay | Admitting: Neurology

## 2011-07-12 NOTE — Telephone Encounter (Signed)
Pt is scheduled for fu in our office on 07/18/2011, but we referred him to have an NCS done and he isn't scheduled for that until after the 05/22 fu appt. Does he need to come into our office before he has the NCS or can he wait until after?

## 2011-07-12 NOTE — Telephone Encounter (Signed)
Called and spoke with the patient. NCV/EMG scheduled on 06/07. Follow up appointment rescheduled for 06/21. The patient was fine with this plan.

## 2011-07-12 NOTE — Telephone Encounter (Signed)
I would have him wait until after.

## 2011-07-13 ENCOUNTER — Other Ambulatory Visit: Payer: Self-pay | Admitting: Nurse Practitioner

## 2011-07-16 ENCOUNTER — Ambulatory Visit: Payer: BC Managed Care – PPO | Admitting: *Deleted

## 2011-07-18 ENCOUNTER — Ambulatory Visit: Payer: BC Managed Care – PPO | Admitting: Neurology

## 2011-07-19 ENCOUNTER — Telehealth: Payer: Self-pay | Admitting: Internal Medicine

## 2011-07-19 MED ORDER — RAMIPRIL 10 MG PO CAPS: 10.0000 mg | ORAL_CAPSULE | Freq: Every day | ORAL | Status: DC

## 2011-07-19 NOTE — Telephone Encounter (Signed)
Ramipril refill requesting 90 day supply

## 2011-07-27 ENCOUNTER — Ambulatory Visit (INDEPENDENT_AMBULATORY_CARE_PROVIDER_SITE_OTHER): Payer: BC Managed Care – PPO | Admitting: *Deleted

## 2011-07-27 DIAGNOSIS — Z79899 Other long term (current) drug therapy: Secondary | ICD-10-CM

## 2011-07-27 DIAGNOSIS — E7800 Pure hypercholesterolemia, unspecified: Secondary | ICD-10-CM

## 2011-07-27 LAB — HEMOGLOBIN A1C: Hgb A1c MFr Bld: 7 % — ABNORMAL HIGH (ref 4.6–6.5)

## 2011-07-27 LAB — LIPID PANEL
Cholesterol: 273 mg/dL — ABNORMAL HIGH (ref 0–200)
HDL: 44.7 mg/dL (ref >39.00)
Total CHOL/HDL Ratio: 6
VLDL: 65.4 mg/dL — ABNORMAL HIGH (ref 0.0–40.0)

## 2011-07-27 LAB — LDL CHOLESTEROL, DIRECT: Direct LDL: 167.1 mg/dL

## 2011-07-27 LAB — HEPATIC FUNCTION PANEL
Albumin: 4.2 g/dL (ref 3.5–5.2)
Alkaline Phosphatase: 55 U/L (ref 39–117)
Total Protein: 7.6 g/dL (ref 6.0–8.3)

## 2011-08-07 ENCOUNTER — Encounter: Payer: Self-pay | Admitting: *Deleted

## 2011-08-07 ENCOUNTER — Other Ambulatory Visit: Payer: Self-pay | Admitting: *Deleted

## 2011-08-07 MED ORDER — METFORMIN HCL 500 MG PO TABS: 1000.0000 mg | ORAL_TABLET | Freq: Two times a day (BID) | ORAL | Status: DC

## 2011-08-10 ENCOUNTER — Telehealth: Payer: Self-pay | Admitting: Internal Medicine

## 2011-08-10 NOTE — Telephone Encounter (Signed)
He is going to really work on his diet and exercise.  He does not want a referral to lipid clinic at this point

## 2011-08-10 NOTE — Progress Notes (Signed)
lmom for patient to call me back to discuss lipid clinic referral

## 2011-08-10 NOTE — Telephone Encounter (Signed)
New problem:  Patient calling stating someone just called him , returning call back .

## 2011-08-14 NOTE — Progress Notes (Signed)
EMG/NCS - revealed a chronic left l4 radiculopathy.

## 2011-08-17 ENCOUNTER — Ambulatory Visit: Payer: BC Managed Care – PPO | Admitting: Neurology

## 2011-08-20 ENCOUNTER — Other Ambulatory Visit: Payer: Self-pay | Admitting: Nurse Practitioner

## 2011-08-20 ENCOUNTER — Encounter: Payer: Self-pay | Admitting: Neurology

## 2011-09-04 ENCOUNTER — Ambulatory Visit (INDEPENDENT_AMBULATORY_CARE_PROVIDER_SITE_OTHER): Payer: BC Managed Care – PPO | Admitting: Neurology

## 2011-09-04 ENCOUNTER — Encounter: Payer: Self-pay | Admitting: Neurology

## 2011-09-04 VITALS — BP 138/82 | HR 88 | Ht 73.0 in | Wt 204.0 lb

## 2011-09-04 DIAGNOSIS — M5430 Sciatica, unspecified side: Secondary | ICD-10-CM

## 2011-09-04 DIAGNOSIS — I6350 Cerebral infarction due to unspecified occlusion or stenosis of unspecified cerebral artery: Secondary | ICD-10-CM

## 2011-09-04 DIAGNOSIS — M5432 Sciatica, left side: Secondary | ICD-10-CM

## 2011-09-04 MED ORDER — NORTRIPTYLINE HCL 25 MG PO CAPS: ORAL_CAPSULE | ORAL | Status: DC

## 2011-09-04 NOTE — Progress Notes (Signed)
Dear Dr. Rosiland Oz,  I saw  Tommy Morse back in [facility] for his problem with chronic left leg pain.  As you may recall, he is a 61 y.o. year old male with a history of right lateral medullary infarction and a previous L4 laminectomy who has had chronic left buttock and leg pain for at least 4 months.  It was not associated with any injury.  He has had extensive investigation by [facility] including a CT lumbar myelogram and EMG/NCS. I did an MRI L-spine that did not show an obvious cause of his sciatic pain.  [facility] recently repeated an MRI without contrast of his L-spine but unfortunately they did not manage to do a contrasted scan.  He describes the pain as burning radiating from his buttocks down into his testicles, back of the leg, and to the foot.  Worse when standing.  No clear significant back pain.  Multiple ESI injections and other pain interventions have been unhelpful.  Gabapentin and lyrica was not tolerated or was not helpful.  Percocet makes him itchy.  Medical history, social history, and family history were reviewed and have not changed since the last clinic visit except where noted above.  Current Outpatient Prescriptions on File Prior to Visit  Medication Sig Dispense Refill  . clopidogrel (PLAVIX) 75 MG tablet Take 75 mg by mouth daily.        Marland Kitchen ezetimibe (ZETIA) 10 MG tablet Take 10 mg by mouth daily.      Marland Kitchen gemfibrozil (LOPID) 600 MG tablet Take 1 tablet (600 mg total) by mouth 2 (two) times daily before a meal.  60 tablet  3  . glipiZIDE (GLUCOTROL) 5 MG tablet Take 5 mg by mouth daily.       . hydrochlorothiazide (HYDRODIURIL) 25 MG tablet TAKE 1 TABLET BY MOUTH EVERY DAY  30 tablet  6  . metFORMIN (GLUCOPHAGE) 500 MG tablet Take 2 tablets (1,000 mg total) by mouth 2 (two) times daily with a meal.  120 tablet  1  . metoprolol (LOPRESSOR) 50 MG tablet TAKE 1 AND 1/2 TABLETS BY MOUTH TWICE A DAY  270 tablet  3  . nitroGLYCERIN (NITROSTAT) 0.4  MG SL tablet Place 1 tablet (0.4 mg total) under the tongue every 5 (five) minutes x 3 doses as needed for chest pain (up to 3 doses).  25 tablet  3  . oxyCODONE-acetaminophen (PERCOCET) 10-325 MG per tablet Take 1 tablet by mouth every 4 (four) hours as needed. For pain      . ramipril (ALTACE) 10 MG capsule Take 1 capsule (10 mg total) by mouth daily.  30 capsule  5  . aspirin EC 81 MG EC tablet Take 1 tablet (81 mg total) by mouth daily.      . nortriptyline (PAMELOR) 25 MG capsule take 1 tab at night for two weeks then increase to 2.  60 capsule  3  . omega-3 acid ethyl esters (LOVAZA) 1 G capsule Take 2 g by mouth daily.       . pregabalin (LYRICA) 100 MG capsule Take 1 capsule (100 mg total) by mouth 3 (three) times daily. increase to 1 three times a day as directed.  90 capsule  3  . DISCONTD: gabapentin (NEURONTIN) 300 MG capsule 600 mg. increase to 1 tab three times a day as directed        Allergies  Allergen Reactions  . Amiodarone Other (See Comments)    "Made me Dr. Domenick Gong  and [name]."  . Amoxicillin-Pot Clavulanate Rash  . Statins Other (See Comments)    "Puts me in a funk."    ROS:  13 systems were reviewed and he complains of some numbness of the face and sharp pains in the right side of the face are unremarkable.  Exam: . Filed Vitals:   09/04/11 1002  BP: 138/82  Pulse: 88  Height: 6\' 1"  (1.854 m)  Weight: 204 lb (92.534 kg)    In general, well appearing man.  Provocative maneuvers:  SLR -, internal external abduction of the hip negative.    Motor:  Normal bulk and tone, no drift and 5/5 muscle strength bilaterally.  Reflexes:  2+ LE, except absent left knee jerk.  Coordination:  Normal finger to nose  Gait:  Antalgic gait and station.  Romberg negative.  EMG/NCS ordered by myself revealed chronic neuropathic changes in left L4.  Impression/Recommendations:  1.  Ischemic stroke - continue clopidogrel, only has some residual right facial numbness 2.   Left sciatic type pain - no clear etiology.  I am going to try Pamelor 25->50 qhs although I am uncertain it will offer benefit.   If it does it can be titrated up to 100-150mg  as side effects permit. He may need chronic opioid therapy and I have asked him to talk to Dr. Ethelene Hal or Dr. Shelle Iron about this.  As I will be leaving the [place] practice and moving to [facility] he will have to follow up with yourself.  Lupita Raider Modesto Charon, MD [facility], [place]

## 2011-09-15 ENCOUNTER — Other Ambulatory Visit (HOSPITAL_COMMUNITY): Payer: Self-pay | Admitting: Cardiology

## 2011-09-17 NOTE — Telephone Encounter (Signed)
..   Requested Prescriptions   Signed Prescriptions Disp Refills  . gemfibrozil (LOPID) 600 MG tablet 60 tablet 3    Sig: TAKE 1 TABLET (600 MG TOTAL) BY MOUTH 2 (TWO) TIMES DAILY BEFORE A MEAL.    Authorizing Provider: Bridgette Habermann, JESSICA A    Ordering User: Lacie Scotts

## 2011-11-05 ENCOUNTER — Ambulatory Visit: Admit: 2011-11-05 | Payer: Self-pay | Admitting: Neurosurgery

## 2011-11-05 SURGERY — LUMBAR LAMINECTOMY/DECOMPRESSION MICRODISCECTOMY 1 LEVEL
Anesthesia: General | Site: Back | Laterality: Left

## 2012-01-14 ENCOUNTER — Other Ambulatory Visit: Payer: Self-pay | Admitting: Neurosurgery

## 2012-01-14 DIAGNOSIS — R102 Pelvic and perineal pain: Secondary | ICD-10-CM

## 2012-01-15 ENCOUNTER — Other Ambulatory Visit: Payer: Self-pay | Admitting: *Deleted

## 2012-01-15 ENCOUNTER — Other Ambulatory Visit (HOSPITAL_COMMUNITY): Payer: Self-pay | Admitting: Cardiology

## 2012-01-15 MED ORDER — RAMIPRIL 10 MG PO CAPS: 10.0000 mg | ORAL_CAPSULE | Freq: Every day | ORAL | Status: DC

## 2012-01-17 ENCOUNTER — Ambulatory Visit
Admission: RE | Admit: 2012-01-17 | Discharge: 2012-01-17 | Disposition: A | Discharge status: Home / Self Care | Payer: BC Managed Care – PPO | Source: Ambulatory Visit | Attending: Neurosurgery | Admitting: Neurosurgery

## 2012-01-17 DIAGNOSIS — R102 Pelvic and perineal pain: Secondary | ICD-10-CM

## 2012-01-17 MED ORDER — IOHEXOL 300 MG/ML  SOLN
125.0000 mL | Freq: Once | INTRAMUSCULAR | Status: AC | PRN
  Administered 2012-01-17: 125 mL via INTRAVENOUS

## 2012-03-18 ENCOUNTER — Telehealth: Payer: Self-pay | Admitting: Neurology

## 2012-03-18 NOTE — Telephone Encounter (Signed)
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

## 2012-03-18 NOTE — Telephone Encounter (Signed)
Ok to schedule with Dr. Smiley Houseman as former Modesto Charon patient; patient has [employer] which has been authorized. Thanks.

## 2012-03-28 ENCOUNTER — Ambulatory Visit (INDEPENDENT_AMBULATORY_CARE_PROVIDER_SITE_OTHER): Payer: BC Managed Care – PPO | Admitting: Neurology

## 2012-03-28 ENCOUNTER — Encounter: Payer: Self-pay | Admitting: Neurology

## 2012-03-28 VITALS — BP 120/70 | HR 80 | Temp 97.9°F | Resp 12 | Ht 73.0 in | Wt 203.0 lb

## 2012-03-28 DIAGNOSIS — I6350 Cerebral infarction due to unspecified occlusion or stenosis of unspecified cerebral artery: Secondary | ICD-10-CM

## 2012-03-28 DIAGNOSIS — M5430 Sciatica, unspecified side: Secondary | ICD-10-CM

## 2012-03-28 NOTE — Progress Notes (Deleted)
Subjective:    Patient ID: Tommy Morse is a 62 y.o. male.  HPI {Common ambulatory SmartLinks:[id number]}  Review of Systems  Objective:  Neurologic Exam  Physical Exam  Assessment:   ***  Plan:   ***

## 2012-03-28 NOTE — Progress Notes (Signed)
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 His final MRI showed a 2 x 4 mm lesion consistent with CVA. His initial symptoms were pain and paresthesias on the right side of the face and head as well as loss of balance and falling to the right. Workup did not reveal any significant carotid, vertebral or intracranial stenosis.he has been continuing on Plavix 75 mg a day without side effects and without further symptoms of TIA or recurring stroke. He does have some residual pings of pain in the right for head area from time to time but do not significantly disrupt his activities.  He continues on metformin and glipizide as well as verapamil for borderline diabetes and blood pressure.  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  This started after falling off his running lawnmower onto he'll when his front wheel when ago for whole. His left foot caught in the clutch in his left cerebellum hit the ground hard. He developed tingling and burning pain down the leg and into the left testicle and down as far as the heels sometimes the foot will feel burning. MRI showed some minor lateral recess stenosis and hypertrophy of the ligamentum flavum. He did have a procedure to shave a bone spur which did not provide any relief of the symptoms. He is now getting a second opinion at [facility]. He says there'll injections, acupuncture and massage therapy. He is considering a spinal cord stimulator if the neurosurgeon at [facility] do not feel there are other good surgical alternatives. It's been frustrating.  Review of symptoms is positive for occasional nocturia, left leg pain and difficulty exercising due to this and occasional reflux if he eats spicy foods late in the evening. Review of symptoms is otherwise negative.  Past Medical History  Diagnosis Date  . Hypertension   . Hyperlipidemia     intolerance to statins  . LVH (left ventricular  hypertrophy)   . Hypertriglyceridemia     Triglycerides 741 March '13 - Initiated on Gemfibrozil  . Diabetes mellitus     A1C 7.0 March '13  . Coronary artery disease     CABG - x6 - 2008, - Lexiscan myoview 05/14/11 no inducible ischemia, EF 68%  . 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 Neuropathy     Current Outpatient Prescriptions on File Prior to Visit  Medication Sig Dispense Refill  . clopidogrel (PLAVIX) 75 MG tablet Take 75 mg by mouth daily.        Marland Kitchen ezetimibe (ZETIA) 10 MG tablet Take 10 mg by mouth daily.      Marland Kitchen gemfibrozil (LOPID) 600 MG tablet TAKE 1 TABLET (600 MG TOTAL) BY MOUTH 2 (TWO) TIMES DAILY BEFORE A MEAL.  60 tablet  3  . glipiZIDE (GLUCOTROL) 5 MG tablet Take 5 mg by mouth daily.       . hydrochlorothiazide (HYDRODIURIL) 25 MG tablet TAKE 1 TABLET BY MOUTH EVERY DAY  30 tablet  6  . metFORMIN (GLUCOPHAGE) 500 MG tablet Take 2 tablets (1,000 mg total) by mouth 2 (two) times daily with a meal.  120 tablet  1  . metoprolol (LOPRESSOR) 50 MG tablet TAKE 1 AND 1/2 TABLETS BY MOUTH TWICE A DAY  270 tablet  3  . nitroGLYCERIN (NITROSTAT) 0.4 MG SL tablet Place 1 tablet (0.4 mg total) under the tongue every 5 (five) minutes  x 3 doses as needed for chest pain (up to 3 doses).  25 tablet  3  . oxyCODONE-acetaminophen (PERCOCET) 10-325 MG per tablet Take 1 tablet by mouth every 4 (four) hours as needed. For pain      . ramipril (ALTACE) 10 MG capsule Take 1 capsule (10 mg total) by mouth daily.  30 capsule  5  . aspirin EC 81 MG EC tablet Take 1 tablet (81 mg total) by mouth daily.      . nortriptyline (PAMELOR) 25 MG capsule take 1 tab at night for two weeks then increase to 2.  60 capsule  3  . omega-3 acid ethyl esters (LOVAZA) 1 G capsule Take 2 g by mouth daily.       . pregabalin (LYRICA) 100 MG capsule Take 1 capsule (100 mg total) by mouth 3 (three) times daily. increase to 1 three times a day as directed.  90 capsule  3  .  [DISCONTINUED] gabapentin (NEURONTIN) 300 MG capsule 600 mg. increase to 1 tab three times a day as directed       Amiodarone; Amoxicillin-pot clavulanate; and Statins  History   Social History  . Marital Status: Married    Spouse Name: N/A    Number of Children: N/A  . Years of Education: N/A   Occupational History  . Not on file.   Social History Main Topics  . Smoking status: Never Smoker   . Smokeless tobacco: Never Used  . Alcohol Use: No  . Drug Use: No  . Sexually Active: Not on file   Other Topics Concern  . Not on file   Social History Narrative  . No narrative on file    Family History  Problem Relation Age of Onset  . Heart disease Mother     valvular heart disease / Strong Family History  . Heart attack Father   . Heart attack Mother   . Hypertension Brother     Alert and oriented x 3.  Memory function appears to be intact.  Concentration and attention are normal for educational level and background.  Speech is fluent and without significant word finding difficulty.  Is aware of current events.  No carotid bruits detected.  Cranial nerve II through XII are within normal limits.  This includes normal optic discs and acuity, EOMI, PERLA, facial movement and sensation intact, hearing grossly intact, gag intact,Uvula raises symmetrically and tongue protrudes evenly. Motor strength is 5 over 5 throughout all limbs.  No atrophy, abnormal tone or tremors. Reflexes are 2+ and symmetric in the upper and lower extremities Sensory exam reveals slightly decreased pin in hte right forehead. Coordination is intact for fine movements and rapid alternating movements in all limbs Gait and station are normal.   Impression: 1. Small CVA in the right middle and 2012 with minimal residual symptoms and no evidence of large vessel atherosclerotic blockage. He is stable on his current regimen of Plavix as well as his medications for borderline diabetes including an ACE inhibitor and  cholesterol lowering medications. 2. Neuropathic pain in the right sciatic distribution following an injury.  This is still unresolved.  Plan.  1. Continue his regimen of ACE inhibitor, Plavix and cholesterol lowering medications for stroke prevention 2. Call if any new stroke like symptoms occur, and/or report to the emergency room. 3. If he wishes to pursue any nonsurgical treatments for the sciatica, he can call for a followup visit here after he is finished with his neurosurgical evaluation at  [facility].

## 2012-03-28 NOTE — Patient Instructions (Addendum)
Follow up as needed

## 2012-04-01 ENCOUNTER — Other Ambulatory Visit: Payer: Self-pay | Admitting: Neurosurgery

## 2012-04-01 ENCOUNTER — Other Ambulatory Visit: Payer: Self-pay | Admitting: Emergency Medicine

## 2012-04-01 DIAGNOSIS — IMO0002 Reserved for concepts with insufficient information to code with codable children: Secondary | ICD-10-CM

## 2012-04-01 MED ORDER — HYDROCHLOROTHIAZIDE 25 MG PO TABS: 25.0000 mg | ORAL_TABLET | Freq: Every day | ORAL | Status: DC

## 2012-04-07 ENCOUNTER — Other Ambulatory Visit: Payer: BC Managed Care – PPO

## 2012-04-09 ENCOUNTER — Other Ambulatory Visit: Payer: BC Managed Care – PPO

## 2012-04-10 ENCOUNTER — Ambulatory Visit: Payer: BC Managed Care – PPO | Admitting: Internal Medicine

## 2012-04-16 ENCOUNTER — Ambulatory Visit
Admission: RE | Admit: 2012-04-16 | Discharge: 2012-04-16 | Disposition: A | Discharge status: Home / Self Care | Payer: No Typology Code available for payment source | Source: Ambulatory Visit | Attending: Neurosurgery | Admitting: Neurosurgery

## 2012-04-17 ENCOUNTER — Telehealth: Payer: Self-pay | Admitting: Neurology

## 2012-04-17 NOTE — Telephone Encounter (Signed)
Picked up a call from the patient. He reports that he is having injections in his lower back at [facility] next week and needs to get permission from Dr. Smiley Houseman to come off of his Plavix for one week. I let him know that Dr. Smiley Houseman was out of the office until tomorrow and he was ok to wait. He will call be back with a number to fax his response to.  **Dr. Smiley Houseman, please advise.

## 2012-04-18 ENCOUNTER — Encounter: Payer: Self-pay | Admitting: Neurology

## 2012-04-18 NOTE — Telephone Encounter (Signed)
04/18/12: Per Dr. Smiley Houseman: "pateint may discontinue palvix several days before steroid injections and resume after the procedure." MS

## 2012-05-05 ENCOUNTER — Ambulatory Visit (INDEPENDENT_AMBULATORY_CARE_PROVIDER_SITE_OTHER): Payer: Self-pay | Admitting: Internal Medicine

## 2012-05-05 VITALS — BP 125/78 | HR 80 | Ht 73.0 in | Wt 197.0 lb

## 2012-05-05 DIAGNOSIS — I2510 Atherosclerotic heart disease of native coronary artery without angina pectoris: Secondary | ICD-10-CM

## 2012-05-05 DIAGNOSIS — I10 Essential (primary) hypertension: Secondary | ICD-10-CM

## 2012-05-05 NOTE — Progress Notes (Signed)
PCP:  Avelino Leeds, MD  The patient presents today for routine cardiology followup.  Since his last visit to our office, he reports doing very well.  His primary limitation remains sciatica/ leg pain.  This pain is quite bothersome to him.  His workup has been extensive but mostly unrevealing.  He remains active as Education officer, environmental for [employer].  Today, he denies symptoms of palpitations, chest pain, shortness of breath or other concerns.  Past Medical History  Diagnosis Date  . Hypertension   . Hyperlipidemia     intolerance to statins  . LVH (left ventricular hypertrophy)   . Hypertriglyceridemia     Triglycerides 741 March '13 - Initiated on Gemfibrozil  . Diabetes mellitus     A1C 7.0 March '13  . Coronary artery disease     CABG - x6 - 2008, - Lexiscan myoview 05/14/11 no inducible ischemia, EF 68%  . 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 Neuropathy    Past Surgical History  Procedure Laterality Date  . Cardiac catheterization  04/19/2206    EF 60-65%  . 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 Laminectomy  06/2009    L3-L4  . Back surgery  2013    Current Outpatient Prescriptions  Medication Sig Dispense Refill  . clopidogrel (PLAVIX) 75 MG tablet Take 75 mg by mouth daily.        Marland Kitchen ezetimibe (ZETIA) 10 MG tablet Take 10 mg by mouth daily.      Marland Kitchen gemfibrozil (LOPID) 600 MG tablet TAKE 1 TABLET (600 MG TOTAL) BY MOUTH 2 (TWO) TIMES DAILY BEFORE A MEAL.  60 tablet  3  . glipiZIDE (GLUCOTROL) 5 MG tablet Take 5 mg by mouth daily.       . hydrochlorothiazide (HYDRODIURIL) 25 MG tablet Take 1 tablet (25 mg total) by mouth daily.  30 tablet  6  . metFORMIN (GLUCOPHAGE) 500 MG tablet Take 2 tablets (1,000 mg total) by mouth 2 (two) times daily with a meal.  120 tablet  1  . metoprolol (LOPRESSOR) 50 MG tablet TAKE 1 AND 1/2 TABLETS BY MOUTH TWICE A DAY  270 tablet  3  . nitroGLYCERIN (NITROSTAT) 0.4 MG SL tablet Place 1  tablet (0.4 mg total) under the tongue every 5 (five) minutes x 3 doses as needed for chest pain (up to 3 doses).  25 tablet  3  . omega-3 acid ethyl esters (LOVAZA) 1 G capsule Take 2 g by mouth daily.       . ramipril (ALTACE) 10 MG capsule Take 1 capsule (10 mg total) by mouth daily.  30 capsule  5  . [DISCONTINUED] gabapentin (NEURONTIN) 300 MG capsule 600 mg. increase to 1 tab three times a day as directed       No current facility-administered medications for this visit.    Allergies  Allergen Reactions  . Amiodarone Other (See Comments)    "Made me Dr. Domenick Gong and [name]."  . Amoxicillin-Pot Clavulanate Rash  . Statins Other (See Comments)    "Puts me in a funk."    History   Social History  . Marital Status: Married    Spouse Name: N/A    Number of Children: N/A  . Years of Education: N/A   Occupational History  . Not on file.   Social History Main Topics  . Smoking status: Never Smoker   . Smokeless tobacco: Never  Used  . Alcohol Use: No  . Drug Use: No  . Sexually Active: Not on file   Other Topics Concern  . Not on file   Social History Narrative  . No narrative on file    Family History  Problem Relation Age of Onset  . Heart disease Mother     valvular heart disease / Strong Family History  . Heart attack Father   . Heart attack Mother   . Hypertension Brother     Physical Exam: Filed Vitals:   05/05/12 1516  BP: 125/78  Pulse: 80  Height: 6\' 1"  (1.854 m)  Weight: 197 lb (89.359 kg)    GEN- The patient is well appearing, alert and oriented x 3 today.   Head- normocephalic, atraumatic Eyes-  Sclera clear, conjunctiva pink Ears- hearing intact Oropharynx- clear Neck- supple, no JVP Lymph- no cervical lymphadenopathy Lungs- Clear to ausculation bilaterally, normal work of breathing Heart- Regular rate and rhythm, no murmurs, rubs or gallops, PMI not laterally displaced GI- soft, NT, ND, + BS Extremities- no clubbing, cyanosis, or  edema  ekg today reveals sinus rhythm 84 bpm, PR 212, inferior infarction, lateral TWI  Assessment and Plan:

## 2012-05-05 NOTE — Patient Instructions (Signed)
Your physician wants you to follow-up in: 12 months with Dr Allred You will receive a reminder letter in the mail two months in advance. If you don't receive a letter, please call our office to schedule the follow-up appointment.  

## 2012-05-05 NOTE — Assessment & Plan Note (Signed)
Stable No change required today  

## 2012-05-05 NOTE — Assessment & Plan Note (Signed)
Stable No change required today  Consider repeat echo upon return

## 2012-05-13 ENCOUNTER — Other Ambulatory Visit (HOSPITAL_COMMUNITY): Payer: Self-pay | Admitting: Cardiology

## 2012-05-15 ENCOUNTER — Other Ambulatory Visit: Payer: Self-pay | Admitting: Cardiology

## 2012-05-15 MED ORDER — EZETIMIBE 10 MG PO TABS: 10.0000 mg | ORAL_TABLET | Freq: Every day | ORAL | Status: DC

## 2012-05-27 ENCOUNTER — Other Ambulatory Visit: Payer: Self-pay | Admitting: *Deleted

## 2012-05-27 MED ORDER — CLOPIDOGREL BISULFATE 75 MG PO TABS: 75.0000 mg | ORAL_TABLET | Freq: Every day | ORAL | Status: DC

## 2012-06-03 ENCOUNTER — Other Ambulatory Visit: Payer: Self-pay | Admitting: Cardiology

## 2012-06-03 MED ORDER — GLIPIZIDE 5 MG PO TABS: 5.0000 mg | ORAL_TABLET | Freq: Every day | ORAL | Status: DC

## 2012-07-08 ENCOUNTER — Other Ambulatory Visit: Payer: Self-pay | Admitting: *Deleted

## 2012-07-08 MED ORDER — RAMIPRIL 10 MG PO CAPS: 10.0000 mg | ORAL_CAPSULE | Freq: Every day | ORAL | Status: DC

## 2012-07-23 ENCOUNTER — Ambulatory Visit (INDEPENDENT_AMBULATORY_CARE_PROVIDER_SITE_OTHER): Payer: BC Managed Care – PPO | Admitting: Family Medicine

## 2012-07-23 ENCOUNTER — Encounter: Payer: Self-pay | Admitting: Family Medicine

## 2012-07-23 VITALS — BP 127/80 | HR 80 | Temp 97.9°F | Resp 16 | Ht 72.0 in | Wt 198.2 lb

## 2012-07-23 DIAGNOSIS — K219 Gastro-esophageal reflux disease without esophagitis: Secondary | ICD-10-CM | POA: Insufficient documentation

## 2012-07-23 MED ORDER — OMEPRAZOLE 40 MG PO CPDR: 40.0000 mg | DELAYED_RELEASE_CAPSULE | Freq: Every day | ORAL | Status: DC

## 2012-07-23 NOTE — Patient Instructions (Addendum)
Gastroesophageal Reflux Disease, Adult  Gastroesophageal reflux disease (GERD) happens when acid from your stomach flows up into the esophagus. When acid comes in contact with the esophagus, the acid causes soreness (inflammation) in the esophagus. Over time, GERD may create small holes (ulcers) in the lining of the esophagus.  CAUSES   · Increased body weight. This puts pressure on the stomach, making acid rise from the stomach into the esophagus.  · Smoking. This increases acid production in the stomach.  · Drinking alcohol. This causes decreased pressure in the lower esophageal sphincter (valve or ring of muscle between the esophagus and stomach), allowing acid from the stomach into the esophagus.  · Late evening meals and a full stomach. This increases pressure and acid production in the stomach.  · A malformed lower esophageal sphincter.  Sometimes, no cause is found.  SYMPTOMS   · Burning pain in the lower part of the mid-chest behind the breastbone and in the mid-stomach area. This may occur twice a week or more often.  · Trouble swallowing.  · Sore throat.  · Dry cough.  · Asthma-like symptoms including chest tightness, shortness of breath, or wheezing.  DIAGNOSIS   Your caregiver may be able to diagnose GERD based on your symptoms. In some cases, X-rays and other tests may be done to check for complications or to check the condition of your stomach and esophagus.  TREATMENT   Your caregiver may recommend over-the-counter or prescription medicines to help decrease acid production. Ask your caregiver before starting or adding any new medicines.   HOME CARE INSTRUCTIONS   · Change the factors that you can control. Ask your caregiver for guidance concerning weight loss, quitting smoking, and alcohol consumption.  · Avoid foods and drinks that make your symptoms worse, such as:  · Caffeine or alcoholic drinks.  · Chocolate.  · Peppermint or mint flavorings.  · Garlic and onions.  · Spicy foods.  · Citrus fruits,  such as oranges, lemons, or limes.  · Tomato-based foods such as sauce, chili, salsa, and pizza.  · Fried and fatty foods.  · Avoid lying down for the 3 hours prior to your bedtime or prior to taking a nap.  · Eat small, frequent meals instead of large meals.  · Wear loose-fitting clothing. Do not wear anything tight around your waist that causes pressure on your stomach.  · Raise the head of your bed 6 to 8 inches with wood blocks to help you sleep. Extra pillows will not help.  · Only take over-the-counter or prescription medicines for pain, discomfort, or fever as directed by your caregiver.  · Do not take aspirin, ibuprofen, or other nonsteroidal anti-inflammatory drugs (NSAIDs).  SEEK IMMEDIATE MEDICAL CARE IF:   · You have pain in your arms, neck, jaw, teeth, or back.  · Your pain increases or changes in intensity or duration.  · You develop nausea, vomiting, or sweating (diaphoresis).  · You develop shortness of breath, or you faint.  · Your vomit is green, yellow, black, or looks like coffee grounds or blood.  · Your stool is red, bloody, or black.  These symptoms could be signs of other problems, such as heart disease, gastric bleeding, or esophageal bleeding.  MAKE SURE YOU:   · Understand these instructions.  · Will watch your condition.  · Will get help right away if you are not doing well or get worse.  Document Released: 11/22/2004 Document Revised: 05/07/2011 Document Reviewed: 09/01/2010  [employer]® Patient   Information ©2014 [employer].

## 2012-07-23 NOTE — Progress Notes (Signed)
Office Note 07/23/2012  CC:  Chief Complaint  Patient presents with  . Establish Care    NP to establish; pt c/o Acid Reflux x2 wks.    HPI:  Tommy Morse is a 62 y.o. White male who is here to establish care. Patient's most recent primary MD: Dr. Rosiland Oz at [facility] in [facility].  Cardiologist is Dr. Johney Frame.  Dr. Dutch Quint, Dr. Marcello Moores, and Dr. Geryl Rankins are all neurosurgeons he has been seeing lately. Old records in EPIC/[facility] EMR were reviewed prior to or during today's visit.  Reports about 2 wks of substernal burning and excessive burping, some mild dysphagia has been noted for "a while".  No pain with swallowing. Zantac 75mg  bid has helped but he still deals with it some.  Has had brief episodes of GER before but had a recent severe spell one day about 10d/a so he wanted to get checked out. He has never had upper endoscopy or been on a daily PPI in the past.   Past Medical History  Diagnosis Date  . Hypertension   . Hyperlipidemia     intolerance to statins  . LVH (left ventricular hypertrophy)   . Hypertriglyceridemia     Triglycerides 741 March '13 - Initiated on Gemfibrozil  . Diabetes mellitus     A1C 7.0 March '13  . Coronary artery disease     CABG - x6 - 2008, - Lexiscan myoview 05/14/11 no inducible ischemia, EF 68%  . 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 Neuropathy   . Left hip pain     Dr. Ramos--injections no help---long record of specialists, still no final explanation and spinal stimulator may be ultimate treatment  . BPH (benign prostatic hypertrophy)     Past Surgical History  Procedure Laterality Date  . Cardiac catheterization  04/19/2206    EF 60-65%  . 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 Laminectomy  06/2009    L3-L4  . Back surgery  2013    Family History  Problem Relation Age of Onset  . Heart disease Mother     valvular heart disease / Strong Family History  . Heart attack Father    . Heart attack Mother   . Hypertension Brother     History   Social History  . Marital Status: Married    Spouse Name: N/A    Number of Children: N/A  . Years of Education: N/A   Occupational History  . Not on file.   Social History Main Topics  . Smoking status: Never Smoker   . Smokeless tobacco: Never Used  . Alcohol Use: No  . Drug Use: No  . Sexually Active: Not on file   Other Topics Concern  . Not on file   Social History Narrative   Married, 2 sons and 1 daughter.  Two grandsons.   Orig from [place].  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   No T/A/Ds.    Outpatient Encounter Prescriptions as of 07/23/2012  Medication Sig Dispense Refill  . clopidogrel (PLAVIX) 75 MG tablet Take 1 tablet (75 mg total) by mouth daily.  90 tablet  1  . ezetimibe (ZETIA) 10 MG tablet Take 1 tablet (10 mg total) by mouth daily.  30 tablet  6  . gemfibrozil (LOPID) 600 MG tablet TAKE 1 TABLET (600 MG TOTAL) BY MOUTH 2 (TWO) TIMES DAILY BEFORE A MEAL.  60 tablet  3  . glipiZIDE (GLUCOTROL) 5 MG tablet Take 5 mg by mouth 2 (two) times daily before a meal.      . hydrochlorothiazide (HYDRODIURIL) 25 MG tablet Take 1 tablet (25 mg total) by mouth daily.  30 tablet  6  . metFORMIN (GLUCOPHAGE) 500 MG tablet Take 500 mg by mouth 2 (two) times daily with a meal.      . metoprolol (LOPRESSOR) 50 MG tablet TAKE 1 AND 1/2 TABLETS BY MOUTH TWICE A DAY  270 tablet  3  . ramipril (ALTACE) 10 MG capsule Take 1 capsule (10 mg total) by mouth daily.  30 capsule  5  . ranitidine (ZANTAC) 75 MG tablet Take 75-150 mg by mouth daily.      . vitamin B-12 (CYANOCOBALAMIN) 100 MCG tablet Take 50 mcg by mouth daily.      . [DISCONTINUED] glipiZIDE (GLUCOTROL) 5 MG tablet Take 1 tablet (5 mg total) by mouth daily.  30 tablet  4  . [DISCONTINUED] metFORMIN (GLUCOPHAGE) 500 MG tablet Take 2 tablets (1,000 mg total) by mouth 2 (two) times daily with a meal.  120 tablet  1  . omeprazole (PRILOSEC) 40 MG  capsule Take 1 capsule (40 mg total) by mouth daily.  30 capsule  1  . [DISCONTINUED] omega-3 acid ethyl esters (LOVAZA) 1 G capsule Take 2 g by mouth daily.        No facility-administered encounter medications on file as of 07/23/2012.    Allergies  Allergen Reactions  . Amiodarone Other (See Comments)    "Made me Dr. Domenick Gong and [name]."  . Amoxicillin-Pot Clavulanate Rash  . Statins Other (See Comments)    "Puts me in a funk."    ROS Review of Systems  Constitutional: Negative for fever and fatigue.  HENT: Negative for congestion and sore throat.   Eyes: Negative for visual disturbance.  Respiratory: Negative for cough.   Cardiovascular: Negative for chest pain.  Gastrointestinal: Negative for nausea and abdominal pain.  Genitourinary: Negative for dysuria.  Musculoskeletal: Positive for arthralgias (chronic left hip/leg pain---in the midst of a long specialist work-up). Negative for back pain and joint swelling.  Skin: Negative for rash.  Neurological: Negative for weakness and headaches.    PE; Blood pressure 127/80, pulse 80, temperature 97.9 F (36.6 C), temperature source Oral, resp. rate 16, height 6' (1.829 m), weight 198 lb 4 oz (89.926 kg), SpO2 99.00%. Gen: Alert, well appearing.  Patient is oriented to person, place, time, and situation. ENT: Eyes: no injection, icteris, swelling, or exudate.  EOMI, PERRLA. Nose: no drainage or turbinate edema/swelling.  No injection or focal lesion.  Mouth: lips without lesion/swelling.  Oral mucosa pink and moist.  Dentition intact and without obvious caries or gingival swelling.  Oropharynx without erythema, exudate, or swelling.  Neck - No masses or thyromegaly or limitation in range of motion CV: RRR, no m/r/g.   LUNGS: CTA bilat, nonlabored resps, good aeration in all lung fields. ABD: soft, NT, ND, BS normal.  No hepatospenomegaly or mass.  No bruits.  Pertinent labs:  None today  ASSESSMENT AND PLAN:   New pt today:  obtain old records.  GERD (gastroesophageal reflux disease) Start prilosec 40mg  qd. GERD info/diet handout reviewed and given to patient today. He may continue to use zantac 75mg  bid prn for breakthrough symptoms.   An After Visit Summary was printed and given to the patient.  Return in about 1 month (around 08/23/2012) for f/u GERD.

## 2012-07-23 NOTE — Assessment & Plan Note (Signed)
Start prilosec 40mg  qd. GERD info/diet handout reviewed and given to patient today. He may continue to use zantac 75mg  bid prn for breakthrough symptoms.

## 2012-09-02 ENCOUNTER — Other Ambulatory Visit: Payer: Self-pay | Admitting: Physical Medicine and Rehabilitation

## 2012-09-02 DIAGNOSIS — G5700 Lesion of sciatic nerve, unspecified lower limb: Secondary | ICD-10-CM

## 2012-09-04 ENCOUNTER — Other Ambulatory Visit: Payer: BC Managed Care – PPO

## 2012-09-08 ENCOUNTER — Other Ambulatory Visit: Payer: Self-pay | Admitting: Nurse Practitioner

## 2012-09-25 ENCOUNTER — Other Ambulatory Visit: Payer: Self-pay | Admitting: Family Medicine

## 2012-09-25 MED ORDER — OMEPRAZOLE 40 MG PO CPDR: 40.0000 mg | DELAYED_RELEASE_CAPSULE | Freq: Every day | ORAL | Status: DC

## 2012-10-11 ENCOUNTER — Other Ambulatory Visit (HOSPITAL_COMMUNITY): Payer: Self-pay | Admitting: Internal Medicine

## 2012-10-30 ENCOUNTER — Other Ambulatory Visit (HOSPITAL_COMMUNITY): Payer: Self-pay | Admitting: Orthopedic Surgery

## 2012-10-30 DIAGNOSIS — R102 Pelvic and perineal pain: Secondary | ICD-10-CM

## 2012-11-04 ENCOUNTER — Encounter (HOSPITAL_COMMUNITY): Payer: Self-pay

## 2012-11-04 ENCOUNTER — Encounter (HOSPITAL_COMMUNITY)
Admission: RE | Admit: 2012-11-04 | Discharge: 2012-11-04 | Disposition: A | Discharge status: Home / Self Care | Payer: BC Managed Care – PPO | Source: Ambulatory Visit | Attending: Orthopedic Surgery | Admitting: Orthopedic Surgery

## 2012-11-04 ENCOUNTER — Other Ambulatory Visit: Payer: Self-pay | Admitting: Internal Medicine

## 2012-11-04 DIAGNOSIS — Z9181 History of falling: Secondary | ICD-10-CM | POA: Insufficient documentation

## 2012-11-04 DIAGNOSIS — R109 Unspecified abdominal pain: Secondary | ICD-10-CM | POA: Insufficient documentation

## 2012-11-04 DIAGNOSIS — R102 Pelvic and perineal pain: Secondary | ICD-10-CM

## 2012-11-04 MED ORDER — TECHNETIUM TC 99M MEDRONATE IV KIT
25.0000 | PACK | Freq: Once | INTRAVENOUS | Status: AC | PRN
  Administered 2012-11-04: 25 via INTRAVENOUS

## 2012-11-16 ENCOUNTER — Other Ambulatory Visit: Payer: Self-pay | Admitting: Internal Medicine

## 2012-12-09 ENCOUNTER — Encounter: Payer: Self-pay | Admitting: Family Medicine

## 2012-12-09 ENCOUNTER — Ambulatory Visit (INDEPENDENT_AMBULATORY_CARE_PROVIDER_SITE_OTHER): Payer: BC Managed Care – PPO | Admitting: Family Medicine

## 2012-12-09 ENCOUNTER — Encounter: Payer: Self-pay | Admitting: Gastroenterology

## 2012-12-09 VITALS — BP 132/83 | HR 63 | Temp 97.9°F | Resp 18 | Ht 72.0 in | Wt 204.0 lb

## 2012-12-09 DIAGNOSIS — R1310 Dysphagia, unspecified: Secondary | ICD-10-CM

## 2012-12-09 DIAGNOSIS — Z23 Encounter for immunization: Secondary | ICD-10-CM

## 2012-12-09 DIAGNOSIS — E119 Type 2 diabetes mellitus without complications: Secondary | ICD-10-CM

## 2012-12-09 DIAGNOSIS — K219 Gastro-esophageal reflux disease without esophagitis: Secondary | ICD-10-CM

## 2012-12-09 MED ORDER — PANTOPRAZOLE SODIUM 40 MG PO TBEC: 40.0000 mg | DELAYED_RELEASE_TABLET | Freq: Every day | ORAL | Status: DC

## 2012-12-09 NOTE — Progress Notes (Signed)
OFFICE NOTE  12/09/2012  CC:  Chief Complaint  Patient presents with  . Gastrophageal Reflux    follow up     HPI: Patient is a 62 y.o. Caucasian male who is here for 1 mo f/u GERD. Says the prilosec I rx'd has helped his GERD sx's except he still has the feeling of difficulty swallowing on a daily basis. Asks about PPI interaction with plavix.  Has no glucoses to report, says he has trouble getting his glucometer to work. Denies probs with any of his current meds.  ROS: 3-4 days of URI sx's, no signif cough, no fevers, no HA, no face pain or upper teeth pain, no wheezing or SOB. Also chronic left posterior leg burning pain from left glut down--frustrated b/c no one can seem to tell him what this is or help him treat it. Says he most recently saw [place] neurologist but I have not reviewed these notes yet.  Pertinent PMH:  Past Medical History  Diagnosis Date  . Hypertension   . Hyperlipidemia     intolerance to statins  . LVH (left ventricular hypertrophy)   . Hypertriglyceridemia     Triglycerides 741 March '13 - Initiated on Gemfibrozil  . Diabetes mellitus     A1C 7.0 March '13  . Coronary artery disease     CABG - x6 - 2008, - Lexiscan myoview 05/14/11 no inducible ischemia, EF 68%  . 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 Neuropathy   . Left hip pain     Dr. Ramos--injections no help---long record of specialists, still no final explanation and spinal stimulator may be ultimate treatment  . BPH (benign prostatic hypertrophy)    Past surgical, social, and family history reviewed and no changes noted since last office visit.  MEDS:  Outpatient Prescriptions Prior to Visit  Medication Sig Dispense Refill  . clopidogrel (PLAVIX) 75 MG tablet TAKE 1 TABLET (75 MG TOTAL) BY MOUTH DAILY.  90 tablet  1  . ezetimibe (ZETIA) 10 MG tablet Take 1 tablet (10 mg total) by mouth daily.  30 tablet  6  . gemfibrozil (LOPID) 600 MG tablet TAKE  1 TABLET (600 MG TOTAL) BY MOUTH 2 (TWO) TIMES DAILY BEFORE A MEAL.  60 tablet  3  . glipiZIDE (GLUCOTROL) 5 MG tablet Take 5 mg by mouth 2 (two) times daily before a meal.      . hydrochlorothiazide (HYDRODIURIL) 25 MG tablet TAKE 1 TABLET (25 MG TOTAL) BY MOUTH DAILY.  90 tablet  3  . metFORMIN (GLUCOPHAGE) 500 MG tablet Take 500 mg by mouth 2 (two) times daily with a meal.      . metoprolol (LOPRESSOR) 50 MG tablet TAKE 1 AND 1/2 TABLETS BY MOUTH TWICE A DAY  270 tablet  2  . ramipril (ALTACE) 10 MG capsule Take 1 capsule (10 mg total) by mouth daily.  30 capsule  5  . vitamin B-12 (CYANOCOBALAMIN) 100 MCG tablet Take 50 mcg by mouth daily.      Marland Kitchen omeprazole (PRILOSEC) 40 MG capsule Take 1 capsule (40 mg total) by mouth daily.  30 capsule  0  . ranitidine (ZANTAC) 75 MG tablet Take 75-150 mg by mouth daily.       No facility-administered medications prior to visit.    PE: Blood pressure 132/83, pulse 63, temperature 97.9 F (36.6 C), temperature source Temporal, resp. rate 18, height 6' (1.829 m), weight 204 lb (92.534 kg), SpO2  95.00%. Gen: Alert, well appearing.  Patient is oriented to person, place, time, and situation. No further exam today.  IMPRESSION AND PLAN:  1) DM 2, non insulin-requiring. Check HbA1c today.   Bring glucometer in at any time to get teaching on this. Check urine microalb/cr at next f/u in 4 mo. Awaiting old PCP records to completely assess needs regarding labs and routine monitoring for this condition.  2) GERD, with dysphagia.  Change PPI to pantoprazole to minimize potential interaction with plavix.  Refer to GI for consideration of EGD.  3) HTN: The current medical regimen is effective;  continue present plan and medications.  Flu vaccine IM today.  An After Visit Summary was printed and given to the patient.  FOLLOW UP: 23mo f/u DM 2, HTN, GERD

## 2012-12-10 ENCOUNTER — Other Ambulatory Visit: Payer: BC Managed Care – PPO

## 2012-12-10 DIAGNOSIS — E119 Type 2 diabetes mellitus without complications: Secondary | ICD-10-CM

## 2012-12-11 ENCOUNTER — Other Ambulatory Visit: Payer: Self-pay | Admitting: Family Medicine

## 2012-12-11 LAB — COMPREHENSIVE METABOLIC PANEL
ALT: 20 U/L (ref 0–53)
AST: 19 U/L (ref 0–37)
Albumin: 4.6 g/dL (ref 3.5–5.2)
BUN: 24 mg/dL — ABNORMAL HIGH (ref 6–23)
CO2: 27 mEq/L (ref 19–32)
Calcium: 9.9 mg/dL (ref 8.4–10.5)
Chloride: 99 mEq/L (ref 96–112)
Creat: 1.4 mg/dL — ABNORMAL HIGH (ref 0.50–1.35)
Potassium: 4.6 mEq/L (ref 3.5–5.3)
Total Bilirubin: 0.5 mg/dL (ref 0.3–1.2)

## 2012-12-11 LAB — LIPID PANEL
Cholesterol: 243 mg/dL — ABNORMAL HIGH (ref 0–200)
Total CHOL/HDL Ratio: 6.4 Ratio
VLDL: 72 mg/dL — ABNORMAL HIGH (ref 0–40)

## 2012-12-11 LAB — HEMOGLOBIN A1C
Hgb A1c MFr Bld: 8.6 % — ABNORMAL HIGH (ref <5.7)
Mean Plasma Glucose: 200 mg/dL — ABNORMAL HIGH (ref <117)

## 2012-12-16 ENCOUNTER — Other Ambulatory Visit: Payer: Self-pay | Admitting: Family Medicine

## 2012-12-16 MED ORDER — METFORMIN HCL 1000 MG PO TABS: 1000.0000 mg | ORAL_TABLET | Freq: Two times a day (BID) | ORAL | Status: DC

## 2012-12-16 MED ORDER — GLIPIZIDE 5 MG PO TABS: 5.0000 mg | ORAL_TABLET | Freq: Two times a day (BID) | ORAL | Status: DC

## 2012-12-30 ENCOUNTER — Telehealth: Payer: Self-pay | Admitting: Family Medicine

## 2012-12-30 DIAGNOSIS — M5432 Sciatica, left side: Secondary | ICD-10-CM

## 2012-12-30 NOTE — Telephone Encounter (Signed)
Patient is requesting a referral to a general surgeon.

## 2012-12-30 NOTE — Telephone Encounter (Signed)
Please advise 

## 2013-01-01 ENCOUNTER — Other Ambulatory Visit: Payer: Self-pay

## 2013-01-01 DIAGNOSIS — M5432 Sciatica, left side: Secondary | ICD-10-CM | POA: Insufficient documentation

## 2013-01-01 NOTE — Telephone Encounter (Signed)
Patient called back to check on the status of his referral to a general surgeon

## 2013-01-01 NOTE — Telephone Encounter (Signed)
Ok.  Referral to gen surg ordered per pt request.

## 2013-01-07 ENCOUNTER — Encounter: Payer: Self-pay | Admitting: Gastroenterology

## 2013-01-07 ENCOUNTER — Telehealth: Payer: Self-pay

## 2013-01-07 ENCOUNTER — Ambulatory Visit (INDEPENDENT_AMBULATORY_CARE_PROVIDER_SITE_OTHER): Payer: BC Managed Care – PPO | Admitting: Gastroenterology

## 2013-01-07 VITALS — BP 98/60 | HR 78 | Ht 73.0 in | Wt 207.0 lb

## 2013-01-07 DIAGNOSIS — R1319 Other dysphagia: Secondary | ICD-10-CM

## 2013-01-07 DIAGNOSIS — Z1211 Encounter for screening for malignant neoplasm of colon: Secondary | ICD-10-CM

## 2013-01-07 DIAGNOSIS — K219 Gastro-esophageal reflux disease without esophagitis: Secondary | ICD-10-CM

## 2013-01-07 NOTE — Progress Notes (Signed)
History of Present Illness: This is a 62 year old white male who notes the onset of heartburn, regurgitation and solid food dysphagia that all began a few months ago. He was prescribed pantoprazole 40 mg daily and his GERD symptoms have improved. However the solid food dysphagia mainly to meats and breads persists. He has undergone colonoscopies by Dr. Dorena Cookey in the past. His most recent colonoscopy was in May 2004 showing only internal hemorrhoids. Denies weight loss, abdominal pain, constipation, diarrhea, change in stool caliber, melena, hematochezia, nausea, vomiting, chest pain.  Allergies  Allergen Reactions  . Amiodarone Other (See Comments)    "Made me Dr. Domenick Gong and [name]."  . Amoxicillin-Pot Clavulanate Rash  . Statins Other (See Comments)    "Puts me in a funk."   Outpatient Prescriptions Prior to Visit  Medication Sig Dispense Refill  . clopidogrel (PLAVIX) 75 MG tablet TAKE 1 TABLET (75 MG TOTAL) BY MOUTH DAILY.  90 tablet  1  . ezetimibe (ZETIA) 10 MG tablet Take 1 tablet (10 mg total) by mouth daily.  30 tablet  6  . gemfibrozil (LOPID) 600 MG tablet TAKE 1 TABLET (600 MG TOTAL) BY MOUTH 2 (TWO) TIMES DAILY BEFORE A MEAL.  60 tablet  3  . glipiZIDE (GLUCOTROL) 5 MG tablet Take 1 tablet (5 mg total) by mouth 2 (two) times daily before a meal.  180 tablet  6  . hydrochlorothiazide (HYDRODIURIL) 25 MG tablet TAKE 1 TABLET (25 MG TOTAL) BY MOUTH DAILY.  90 tablet  3  . metFORMIN (GLUCOPHAGE) 1000 MG tablet Take 1 tablet (1,000 mg total) by mouth 2 (two) times daily with a meal.  180 tablet  1  . metoprolol (LOPRESSOR) 50 MG tablet TAKE 1 AND 1/2 TABLETS BY MOUTH TWICE A DAY  270 tablet  2  . pantoprazole (PROTONIX) 40 MG tablet Take 1 tablet (40 mg total) by mouth daily.  30 tablet  6  . ramipril (ALTACE) 10 MG capsule Take 1 capsule (10 mg total) by mouth daily.  30 capsule  5  . vitamin B-12 (CYANOCOBALAMIN) 100 MCG tablet Take 50 mcg by mouth daily.       No  facility-administered medications prior to visit.   Past Medical History  Diagnosis Date  . Hypertension   . Hyperlipemia, mixed     intolerance to statins  . LVH (left ventricular hypertrophy)   . Hypertriglyceridemia     Triglycerides 741 March '13 - Initiated on Gemfibrozil  . Diabetes mellitus     A1C 7.2 Aug '13, microalbumin slightly elevated  . Coronary artery disease     CABG - x6 - 2008, - Lexiscan myoview 05/14/11 no inducible ischemia, EF 68%  . 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 Left sided sciatica 2012    neuropathic meds no help/side effects  . Left hip pain 2012    Dr. Ramos--injections no help---long record of specialists ([facility] did CT lumbar myelogram and EMG/NCS.  Neurology did MRI L spine), still no final explanation and spinal stimulator may be ultimate treatment  . BPH (benign prostatic hypertrophy)   . History of balanitis     pt uncircumcised   Past Surgical History  Procedure Laterality Date  . Cardiac catheterization  04/19/2206    EF 60-65%  . 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 Laminectomy  06/2009    L3-L4  .  Back surgery  2013  . Prostate surgery      ?TURP--need old records   History   Social History  . Marital Status: Married    Spouse Name: N/A    Number of Children: N/A  . Years of Education: N/A   Social History Main Topics  . Smoking status: Never Smoker   . Smokeless tobacco: Never Used  . Alcohol Use: No  . Drug Use: No  . Sexual Activity: None   Other Topics Concern  . None   Social History Narrative   Married, 2 sons and 1 daughter.  Two grandsons.   Orig from [place].  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   No T/A/Ds.   Family History  Problem Relation Age of Onset  . Heart disease Mother     valvular heart disease / Strong Family History  . Heart attack Father   . Heart attack Mother   .  Hypertension Brother     Review of Systems: Pertinent positive and negative review of systems were noted in the above HPI section. All other review of systems were otherwise negative.  Physical Exam: General: Well developed , well nourished, no acute distress Head: Normocephalic and atraumatic Eyes:  sclerae anicteric, EOMI Ears: Normal auditory acuity Mouth: No deformity or lesions Neck: Supple, no masses or thyromegaly Lungs: Clear throughout to auscultation Heart: Regular rate and rhythm; no murmurs, rubs or bruits Abdomen: Soft, non tender and non distended. No masses, hepatosplenomegaly or hernias noted. Normal Bowel sounds Rectal: Deferred to colonoscopy Musculoskeletal: Symmetrical with no gross deformities  Skin: No lesions on visible extremities Pulses:  Normal pulses noted Extremities: No clubbing, cyanosis, edema or deformities noted Neurological: Alert oriented x 4, grossly nonfocal Cervical Nodes:  No significant cervical adenopathy Inguinal Nodes: No significant inguinal adenopathy Psychological:  Alert and cooperative. Normal mood and affect  Assessment and Recommendations:  1. GERD and solid food dysphagia. Rule out esophagitis and esophageal strictures. Continue pantoprazole 40 mg a day and standard antireflux measures. Schedule endoscopy with possible dilation. The risks, benefits and alternatives to a 5 day hold Plavix were discussed with the patient and he consents to proceed. Obtain clearance from Dr. Johney Frame. The risks, benefits, and alternatives to endoscopy with possible biopsy and possible dilation were discussed with the patient and they consent to proceed.   2. Colorectal cancer screening, average risk. Plan for elective screening colonoscopy after the holiday season.

## 2013-01-07 NOTE — Telephone Encounter (Signed)
01/07/2013   RE: JAAN FISCHEL DOB: June 03, 1950 MRN: [id number]   Dear Dr. Johney Frame,    We have scheduled the above patient for an endoscopic procedure. Our records show that he is on anticoagulation therapy.   Please advise as to how long the patient may come off his therapy of Plavix prior to the procedure, which is scheduled for 02/17/13.  Please fax back/ or route the completed form to [facility] at [phone].   Sincerely,    Christie Nottingham, CMA

## 2013-01-07 NOTE — Patient Instructions (Signed)
You have been scheduled for an endoscopy with propofol. Please follow written instructions given to you at your visit today. If you use inhalers (even only as needed), please bring them with you on the day of your procedure. Your physician has requested that you go to www.startemmi.com and enter the access code given to you at your visit today. This web site gives a general overview about your procedure. However, you should still follow specific instructions given to you by our office regarding your preparation for the procedure.  Patient advised to avoid spicy, acidic, citrus, chocolate, mints, fruit and fruit juices.  Limit the intake of caffeine, alcohol and Soda.  Don't exercise too soon after eating.  Don't lie down within 3-4 hours of eating.  Elevate the head of your bed.  Thank you for choosing me and [facility].  Malcolm T. Stark, Jr., MD., FACG  

## 2013-01-08 ENCOUNTER — Encounter: Payer: Self-pay | Admitting: Gastroenterology

## 2013-01-09 NOTE — Telephone Encounter (Signed)
He is on plavix for primary stroke.  I will defer the decision to hold plavix to neurology.  Proceed with procedure from a CV standpoint.  If necessary to hold plavix, contact neurology.

## 2013-01-12 NOTE — Telephone Encounter (Signed)
Patient's physician Dr. Smiley Houseman no longer works for [facility]. Liborio Nixon can you ask one of the physicians who will be taking over his care if patient can hold Plavix before procedure? Thanks.

## 2013-01-12 NOTE — Telephone Encounter (Signed)
He will need to be seen first before the MD will give clearance for the procedure. Can you have the patient call to schedule an appointment here prior to the procedure? Thank you.

## 2013-01-12 NOTE — Telephone Encounter (Signed)
Left a message for patient to return my call. 

## 2013-01-13 ENCOUNTER — Ambulatory Visit: Payer: BC Managed Care – PPO | Admitting: Family Medicine

## 2013-01-13 NOTE — Telephone Encounter (Signed)
Patient returned my call and states he will call [facility] to schedule a follow up appt to get Plavix clearance.

## 2013-01-13 NOTE — Telephone Encounter (Signed)
Left a message for patient to return my call. 

## 2013-01-15 ENCOUNTER — Ambulatory Visit (INDEPENDENT_AMBULATORY_CARE_PROVIDER_SITE_OTHER): Payer: BC Managed Care – PPO | Admitting: Neurology

## 2013-01-15 ENCOUNTER — Other Ambulatory Visit: Payer: Self-pay | Admitting: Internal Medicine

## 2013-01-15 ENCOUNTER — Encounter: Payer: Self-pay | Admitting: Neurology

## 2013-01-15 ENCOUNTER — Telehealth: Payer: Self-pay

## 2013-01-15 VITALS — BP 130/78 | HR 68 | Temp 97.9°F | Resp 16 | Ht 73.0 in | Wt 209.0 lb

## 2013-01-15 DIAGNOSIS — I10 Essential (primary) hypertension: Secondary | ICD-10-CM

## 2013-01-15 DIAGNOSIS — G463 Brain stem stroke syndrome: Secondary | ICD-10-CM

## 2013-01-15 DIAGNOSIS — E785 Hyperlipidemia, unspecified: Secondary | ICD-10-CM

## 2013-01-15 DIAGNOSIS — Z8673 Personal history of transient ischemic attack (TIA), and cerebral infarction without residual deficits: Secondary | ICD-10-CM

## 2013-01-15 DIAGNOSIS — I6350 Cerebral infarction due to unspecified occlusion or stenosis of unspecified cerebral artery: Secondary | ICD-10-CM

## 2013-01-15 MED ORDER — CLOPIDOGREL BISULFATE 75 MG PO TABS: 75.0000 mg | ORAL_TABLET | Freq: Every day | ORAL | Status: DC

## 2013-01-15 NOTE — Progress Notes (Signed)
Tommy Morse was seen today in the neurology clinic.  While the patient is new to me, he has previously been seen here by Dr. Modesto Morse and Dr. Smiley Morse.  I reviewed the records made available to me.  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  At that time, he had an MRA of the brain, and subsequently had a CTA that was unremarkable.  A carotid ultrasound was unremarkable per Dr. Modesto Morse (unable to find that study today).  An echocardiogram also was normal.  He was placed on Plavix and Zetia.  He remains on Plavix and is doing well on the medication.  He denies melena or hematochezia.  He is scheduled to have a colonoscopy next month, and needs to be off of the medication in order to have the colonoscopy.  The pt does continue to have facial and head pains (electric like, don't last long but present daily) on the R side of the head, but aren't bothersome enough to require medication.  The patient has also been seen here previously for chronic left buttock and leg pain.  He is status post laminectomy.  The pain is down the posterior aspect of the L leg.  He had an extensive workup of [facility] Morse orthopedics.  He had epidural steroids as well as oral medications, including Neurontin and Lyrica that did not provide significant benefit.  He subsequently went to pain management.   He has been all over, including Tommy Morse.  He was recommended to have a Tommy Morse stimulator but he is leary since there has been no identifiable nerve issue.  He was seen by Dr. Salvatore Morse in [place], had an EMG without any nerve damange noted.  He is still having tx with chiropractics and an accupuncturist.     PREVIOUS MEDICATIONS: n/a  ALLERGIES:   Allergies  Allergen Reactions  . Amiodarone Other (See Comments)    "Made me Dr. Domenick Morse and [name]."  . Amoxicillin-Pot Clavulanate Rash  . Statins Other (See Comments)    "Puts me in a funk."    CURRENT MEDICATIONS:  Current Outpatient Prescriptions on File Prior to  Visit  Medication Sig Dispense Refill  . ezetimibe (ZETIA) 10 MG tablet Take 1 tablet (10 mg total) by mouth daily.  30 tablet  6  . glipiZIDE (GLUCOTROL) 5 MG tablet Take 1 tablet (5 mg total) by mouth 2 (two) times daily before a meal.  180 tablet  6  . hydrochlorothiazide (HYDRODIURIL) 25 MG tablet TAKE 1 TABLET (25 MG TOTAL) BY MOUTH DAILY.  90 tablet  3  . metFORMIN (GLUCOPHAGE) 1000 MG tablet Take 1 tablet (1,000 mg total) by mouth 2 (two) times daily with a meal.  180 tablet  1  . metoprolol (LOPRESSOR) 50 MG tablet TAKE 1 AND 1/2 TABLETS BY MOUTH TWICE A DAY  270 tablet  2  . pantoprazole (PROTONIX) 40 MG tablet Take 1 tablet (40 mg total) by mouth daily.  30 tablet  6  . ramipril (ALTACE) 10 MG capsule Take 1 capsule (10 mg total) by mouth daily.  30 capsule  5  . vitamin B-12 (CYANOCOBALAMIN) 100 MCG tablet Take 50 mcg by mouth daily.      . [DISCONTINUED] gabapentin (NEURONTIN) 300 MG capsule 600 mg. increase to 1 tab three times a day as directed       No current facility-administered medications on file prior to visit.    PAST MEDICAL HISTORY:   Past Medical History  Diagnosis Date  .  Hypertension   . Hyperlipemia, mixed     intolerance to statins  . LVH (left ventricular hypertrophy)   . Hypertriglyceridemia     Triglycerides 741 March '13 - Initiated on Gemfibrozil  . Diabetes mellitus     A1C 7.2 Aug '13, microalbumin slightly elevated  . Coronary artery disease     CABG - x6 - 2008, - Lexiscan myoview 05/14/11 no inducible ischemia, EF 68%  . 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 Left sided sciatica 2012    neuropathic meds no help/side effects  . Left hip pain 2012    Tommy Morse--injections no help---long record of specialists ([facility] did CT lumbar myelogram and EMG/NCS.  Neurology did MRI L spine), still no final explanation and spinal stimulator may be ultimate  treatment  . BPH (benign prostatic hypertrophy)   . History of balanitis     pt uncircumcised    PAST SURGICAL HISTORY:   Past Surgical History  Procedure Laterality Date  . Cardiac catheterization  04/19/2206    EF 60-65%  . 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 Laminectomy  06/2009    L3-L4  . Back surgery  2013  . Prostate surgery      ?TURP--need old records    SOCIAL HISTORY:   History   Social History  . Marital Status: Married    Spouse Name: N/A    Number of Children: N/A  . Years of Education: N/A   Occupational History  . Not on file.   Social History Main Topics  . Smoking status: Never Smoker   . Smokeless tobacco: Never Used  . Alcohol Use: No  . Drug Use: No  . Sexual Activity: Not on file   Other Topics Concern  . Not on file   Social History Narrative   Married, 2 sons and 1 daughter.  Two grandsons.   Orig from [place].  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    FAMILY HISTORY:   Family Status  Relation Status Death Age  . Father Deceased 60    of MI / father also had bypass  . Mother Deceased 48    of MI / and valvular heart disease  . Brother Deceased     from alcohol and drugs  . Brother    . Brother    . Brother    . Sister    . Sister    . Son    . Son    . Daughter    . Brother Deceased     alcohol and drugs    ROS:  A complete 10 system review of systems was obtained and was unremarkable apart from what is mentioned above.  PHYSICAL EXAMINATION:    VITALS:   Filed Vitals:   01/15/13 0918  BP: 130/78  Pulse: 68  Temp: 97.9 F (36.6 C)  Resp: 16  Height: 6\' 1"  (1.854 m)  Weight: 209 lb (94.802 kg)    GEN:  Normal appears male in no acute distress.  Appears stated age. HEENT:  Normocephalic, atraumatic. The mucous membranes are moist. The superficial temporal arteries are without ropiness or tenderness. Cardiovascular: Regular rate and rhythm. Lungs: Clear to auscultation  bilaterally. Neck/Heme: There are no carotid bruits noted bilaterally.  NEUROLOGICAL: Orientation:  The patient is alert and oriented x 3.  Fund of knowledge is  appropriate.  Recent and remote memory intact.  Attention span and concentration normal.  Repeats and names without difficulty. Cranial nerves: There is good facial symmetry. The pupils are equal round and reactive to light bilaterally. Fundoscopic exam reveals clear disc margins bilaterally. Extraocular muscles are intact and visual fields are full to confrontational testing. Speech is fluent and clear. Soft palate rises symmetrically and there is no tongue deviation. Hearing is intact to conversational tone. Tone: Tone is good throughout. Sensation: There is decreased pin across the R face c/t the L.  There is decreased pin over the L superficial peroneal sensory distribution compared to the L. Vibration is intact at the bilateral big toe. There is no extinction with double simultaneous stimulation.  Coordination:  The patient has no difficulty with RAM's or FNF bilaterally. Motor: Strength is 5/5 in the bilateral upper and lower extremities.  Shoulder shrug is equal and symmetric. There is no pronator drift.  There are no fasciculations noted. DTR's: Deep tendon reflexes are 2/4 at the bilateral biceps, triceps, brachioradialis, patella and achilles.  Plantar responses are downgoing bilaterally. Gait and Station: The patient is able to ambulate without difficulty.    IMPRESSION/PLAN  1. S/P Lateral Medullary Infarct (Wallenberg) in 10/2010.    -His current stroke risk factors are hypertension hyperlipidemia, diabetes mellitus, and the greatest risk factor is that he has had a prior stroke now.  We talked about modifying risk factors.  He is watching his diet and exercise  -He will go off of the Plavix for his colonoscopy.  The risks associated with some although I do think that the risk is fairly low.  He will restart the Plavix after the  colonoscopy.  -He will have a carotid ultrasound.  He sees Dr. Johney Frame so we will have him read this.  -If doing well, I will see him yearly. 2.  Chronic left leg pain  -Although I do not know the etiology, he has had a very extensive workup and I would doubt that it would be from the prior Wallenberg infarction, as it is only down the posterior leg.  Nonetheless, even if it was, he has failed meds, injections and pain management.

## 2013-01-15 NOTE — Telephone Encounter (Signed)
Called pt and informed him of his Carotid Ultrasound scheduled for November 21st at 10:45am at [facility].

## 2013-01-16 ENCOUNTER — Other Ambulatory Visit (HOSPITAL_COMMUNITY): Payer: Self-pay | Admitting: Internal Medicine

## 2013-01-16 ENCOUNTER — Ambulatory Visit (HOSPITAL_COMMUNITY)
Admission: RE | Admit: 2013-01-16 | Discharge: 2013-01-16 | Disposition: A | Discharge status: Home / Self Care | Payer: BC Managed Care – PPO | Source: Ambulatory Visit | Attending: Neurology | Admitting: Neurology

## 2013-01-16 DIAGNOSIS — I10 Essential (primary) hypertension: Secondary | ICD-10-CM

## 2013-01-16 DIAGNOSIS — I69998 Other sequelae following unspecified cerebrovascular disease: Secondary | ICD-10-CM | POA: Insufficient documentation

## 2013-01-16 DIAGNOSIS — E785 Hyperlipidemia, unspecified: Secondary | ICD-10-CM

## 2013-01-16 DIAGNOSIS — I6529 Occlusion and stenosis of unspecified carotid artery: Secondary | ICD-10-CM | POA: Insufficient documentation

## 2013-01-16 DIAGNOSIS — I658 Occlusion and stenosis of other precerebral arteries: Secondary | ICD-10-CM | POA: Insufficient documentation

## 2013-01-16 DIAGNOSIS — Z8673 Personal history of transient ischemic attack (TIA), and cerebral infarction without residual deficits: Secondary | ICD-10-CM

## 2013-01-16 DIAGNOSIS — I6350 Cerebral infarction due to unspecified occlusion or stenosis of unspecified cerebral artery: Secondary | ICD-10-CM

## 2013-01-16 NOTE — Progress Notes (Signed)
VASCULAR LAB PRELIMINARY  PRELIMINARY  PRELIMINARY  PRELIMINARY  Carotid duplex completed.    Preliminary report:  Bilateral:  1-39% ICA stenosis.  Vertebral artery flow is antegrade.     Laverna Dossett, RVS 01/16/2013, 3:32 PM

## 2013-01-26 HISTORY — PX: ESOPHAGOGASTRODUODENOSCOPY (EGD) WITH ESOPHAGEAL DILATION: SHX5812

## 2013-02-03 ENCOUNTER — Other Ambulatory Visit (HOSPITAL_COMMUNITY): Payer: Self-pay | Admitting: Internal Medicine

## 2013-02-10 ENCOUNTER — Other Ambulatory Visit: Payer: Self-pay | Admitting: Family Medicine

## 2013-02-11 ENCOUNTER — Other Ambulatory Visit: Payer: Self-pay | Admitting: Family Medicine

## 2013-02-11 MED ORDER — METFORMIN HCL 1000 MG PO TABS: 1000.0000 mg | ORAL_TABLET | Freq: Two times a day (BID) | ORAL | Status: DC

## 2013-02-16 ENCOUNTER — Other Ambulatory Visit (HOSPITAL_COMMUNITY): Payer: Self-pay | Admitting: Internal Medicine

## 2013-02-17 ENCOUNTER — Ambulatory Visit (AMBULATORY_SURGERY_CENTER): Payer: BC Managed Care – PPO | Admitting: Gastroenterology

## 2013-02-17 ENCOUNTER — Encounter: Payer: Self-pay | Admitting: Gastroenterology

## 2013-02-17 VITALS — BP 104/61 | HR 66 | Temp 97.6°F | Resp 21 | Ht 73.0 in | Wt 207.0 lb

## 2013-02-17 DIAGNOSIS — R1319 Other dysphagia: Secondary | ICD-10-CM

## 2013-02-17 DIAGNOSIS — K219 Gastro-esophageal reflux disease without esophagitis: Secondary | ICD-10-CM

## 2013-02-17 MED ORDER — PANTOPRAZOLE SODIUM 40 MG PO TBEC: 40.0000 mg | DELAYED_RELEASE_TABLET | Freq: Two times a day (BID) | ORAL | Status: DC

## 2013-02-17 MED ORDER — SODIUM CHLORIDE 0.9 % IV SOLN: 500.0000 mL | INTRAVENOUS | Status: DC

## 2013-02-17 NOTE — Progress Notes (Signed)
Called to room to assist during endoscopic procedure.  Patient ID and intended procedure confirmed with present staff. Received instructions for my participation in the procedure from the performing physician.  

## 2013-02-17 NOTE — Progress Notes (Signed)
Lidocaine-40mg IV prior to Propofol InductionPropofol given over incremental dosages 

## 2013-02-17 NOTE — Patient Instructions (Addendum)
YOU HAD AN ENDOSCOPIC PROCEDURE TODAY AT THE [facility]: Refer to the procedure report that was given to you for any specific questions about what was found during the examination.  If the procedure report does not answer your questions, please call your gastroenterologist to clarify.  If you requested that your care partner not be given the details of your procedure findings, then the procedure report has been included in a sealed envelope for you to review at your convenience later.  YOU SHOULD EXPECT: Some feelings of bloating in the abdomen. Passage of more gas than usual.  Walking can help get rid of the air that was put into your GI tract during the procedure and reduce the bloating. If you had a lower endoscopy (such as a colonoscopy or flexible sigmoidoscopy) you may notice spotting of blood in your stool or on the toilet paper. If you underwent a bowel prep for your procedure, then you may not have a normal bowel movement for a few days.  DIET: Your first meal following the procedure should be a light meal and then it is ok to progress to your normal diet.  A half-sandwich or bowl of soup is an example of a good first meal.  Heavy or fried foods are harder to digest and may make you feel nauseous or bloated.  Likewise meals heavy in dairy and vegetables can cause extra gas to form and this can also increase the bloating.  Drink plenty of fluids but you should avoid alcoholic beverages for 24 hours.  ACTIVITY: Your care partner should take you home directly after the procedure.  You should plan to take it easy, moving slowly for the rest of the day.  You can resume normal activity the day after the procedure however you should NOT DRIVE or use heavy machinery for 24 hours (because of the sedation medicines used during the test).    SYMPTOMS TO REPORT IMMEDIATELY: A gastroenterologist can be reached at any hour.  During normal business hours, 8:30 AM to 5:00 PM Monday through Friday,  call [phone].  After hours and on weekends, please call the GI answering service at [phone] who will take a message and have the physician on call contact you.   Following lower endoscopy (colonoscopy or flexible sigmoidoscopy):  Excessive amounts of blood in the stool  Significant tenderness or worsening of abdominal pains  Swelling of the abdomen that is new, acute  Fever of 100F or higher    FOLLOW UP: If any biopsies were taken you will be contacted by phone or by letter within the next 1-3 weeks.  Call your gastroenterologist if you have not heard about the biopsies in 3 weeks.  Our staff will call the home number listed on your records the next business day following your procedure to check on you and address any questions or concerns that you may have at that time regarding the information given to you following your procedure. This is a courtesy call and so if there is no answer at the home number and we have not heard from you through the emergency physician on call, we will assume that you have returned to your regular daily activities without incident.  SIGNATURES/CONFIDENTIALITY: You and/or your care partner have signed paperwork which will be entered into your electronic medical record.  These signatures attest to the fact that that the information above on your After Visit Summary has been reviewed and is understood.  Full responsibility of the confidentiality   of this discharge information lies with you and/or your care-partner.   Anti reflux regimen given.  Post dilation instructions given.  Follow up appointment with Dr. Russella Dar in 6 weeks.YOU HAD AN ENDOSCOPIC PROCEDURE TODAY AT THE [facility]: Refer to the procedure report that was given to you for any specific questions about what was found during the examination.  If the procedure report does not answer your questions, please call your gastroenterologist to clarify.  If you requested that your  care partner not be given the details of your procedure findings, then the procedure report has been included in a sealed envelope for you to review at your convenience later.  YOU SHOULD EXPECT: Some feelings of bloating in the abdomen. Passage of more gas than usual.  Walking can help get rid of the air that was put into your GI tract during the procedure and reduce the bloating. If you had a lower endoscopy (such as a colonoscopy or flexible sigmoidoscopy) you may notice spotting of blood in your stool or on the toilet paper. If you underwent a bowel prep for your procedure, then you may not have a normal bowel movement for a few days.  DIET: Your first meal following the procedure should be a light meal and then it is ok to progress to your normal diet.  A half-sandwich or bowl of soup is an example of a good first meal.  Heavy or fried foods are harder to digest and may make you feel nauseous or bloated.  Likewise meals heavy in dairy and vegetables can cause extra gas to form and this can also increase the bloating.  Drink plenty of fluids but you should avoid alcoholic beverages for 24 hours.  ACTIVITY: Your care partner should take you home directly after the procedure.  You should plan to take it easy, moving slowly for the rest of the day.  You can resume normal activity the day after the procedure however you should NOT DRIVE or use heavy machinery for 24 hours (because of the sedation medicines used during the test).    SYMPTOMS TO REPORT IMMEDIATELY: A gastroenterologist can be reached at any hour.  During normal business hours, 8:30 AM to 5:00 PM Monday through Friday, call [phone].  After hours and on weekends, please call the GI answering service at [phone] who will take a message and have the physician on call contact you.   FoFollowing upper endoscopy (EGD)  Vomiting of blood or coffee ground material  New chest pain or pain under the shoulder blades  Painful or  persistently difficult swallowing  New shortness of breath  Fever of 100F or higher  Black, tarry-looking stools  FOLLOW UP: If any biopsies were taken you will be contacted by phone or by letter within the next 1-3 weeks.  Call your gastroenterologist if you have not heard about the biopsies in 3 weeks.  Our staff will call the home number listed on your records the next business day following your procedure to check on you and address any questions or concerns that you may have at that time regarding the information given to you following your procedure. This is a courtesy call and so if there is no answer at the home number and we have not heard from you through the emergency physician on call, we will assume that you have returned to your regular daily activities without incident.  SIGNATURES/CONFIDENTIALITY: You and/or your care partner have signed paperwork which will be entered  into your electronic medical record.  These signatures attest to the fact that that the information above on your After Visit Summary has been reviewed and is understood.  Full responsibility of the confidentiality of this discharge information lies with you and/or your care-partner.

## 2013-02-17 NOTE — Op Note (Signed)
[facility] [address] [place], [address]   ENDOSCOPY PROCEDURE REPORT  PATIENT: Tommy Morse, Tommy Morse  MR#: [id number] BIRTHDATE: December 05, 1950 , 62  yrs. old GENDER: Male ENDOSCOPIST: Meryl Dare, MD, [place] PROCEDURE DATE:  02/17/2013 PROCEDURE:  EGD diagnostic and Savary dilation of esophagus ASA CLASS:     Class II INDICATIONS:  Heartburn.   History of esophageal reflux. Dysphagia. MEDICATIONS: MAC sedation, administered by CRNA and propofol (Diprivan) 100mg  IV TOPICAL ANESTHETIC: Cetacaine Spray DESCRIPTION OF PROCEDURE: After the risks benefits and alternatives of the procedure were thoroughly explained, informed consent was obtained.  The LB [id number] endoscope was introduced through the mouth and advanced to the second portion of the duodenum. Without limitations.  The instrument was slowly withdrawn as the mucosa was fully examined.  ESOPHAGUS: The mucosa of the esophagus appeared normal. STOMACH: The mucosa and folds of the stomach appeared normal. DUODENUM: The duodenal mucosa showed no abnormalities.  Retroflexed views revealed no abnormalities.  A guidewire was placed and the scope was then withdrawn from the patient. A 17 mm Savary dilator was passed to 45 cm over the guidewire for dysphagia without a stricture noted. No resistance or heme noted. The guidewire and dilator were removed and the procedure completed.  COMPLICATIONS: There were no complications.  ENDOSCOPIC IMPRESSION: 1.   The EGD appeared normal  RECOMMENDATIONS: 1.  Anti-reflux regimen 2.  PPI bid: pantoprazole 40 mg po bid, 1 year of refills 3.  Post dilation instructions 4.  OP follow-up in 6 weeks.  eSigned:  Meryl Dare, MD, [facility] 02/17/2013 2:55 PM

## 2013-02-18 ENCOUNTER — Telehealth: Payer: Self-pay | Admitting: *Deleted

## 2013-02-18 NOTE — Telephone Encounter (Signed)
  Follow up Call-  Call back number 02/17/2013 04/12/2011  Post procedure Call Back phone  # [phone]  Permission to leave phone message Yes -     Patient questions:  Do you have a fever, pain , or abdominal swelling? no Pain Score  0 *  Have you tolerated food without any problems? yes  Have you been able to return to your normal activities? yes  Do you have any questions about your discharge instructions: Diet   no Medications  no Follow up visit  no  Do you have questions or concerns about your Care? no  Actions: * If pain score is 4 or above: No action needed, pain <4.

## 2013-03-29 DIAGNOSIS — Z860101 Personal history of adenomatous and serrated colon polyps: Secondary | ICD-10-CM

## 2013-03-29 DIAGNOSIS — Z86010 Personal history of colonic polyps: Secondary | ICD-10-CM

## 2013-03-29 HISTORY — PX: COLONOSCOPY W/ POLYPECTOMY: SHX1380

## 2013-03-29 HISTORY — DX: Personal history of colonic polyps: Z86.010

## 2013-03-29 HISTORY — DX: Personal history of adenomatous and serrated colon polyps: Z86.0101

## 2013-04-01 ENCOUNTER — Telehealth: Payer: Self-pay

## 2013-04-01 ENCOUNTER — Encounter: Payer: Self-pay | Admitting: Gastroenterology

## 2013-04-01 ENCOUNTER — Ambulatory Visit: Payer: BC Managed Care – PPO | Admitting: Gastroenterology

## 2013-04-01 ENCOUNTER — Ambulatory Visit (INDEPENDENT_AMBULATORY_CARE_PROVIDER_SITE_OTHER): Payer: BC Managed Care – PPO | Admitting: Gastroenterology

## 2013-04-01 VITALS — BP 100/20 | HR 65 | Ht 73.0 in | Wt 206.0 lb

## 2013-04-01 DIAGNOSIS — R1319 Other dysphagia: Secondary | ICD-10-CM

## 2013-04-01 DIAGNOSIS — Z1211 Encounter for screening for malignant neoplasm of colon: Secondary | ICD-10-CM

## 2013-04-01 DIAGNOSIS — K219 Gastro-esophageal reflux disease without esophagitis: Secondary | ICD-10-CM

## 2013-04-01 MED ORDER — PEG-KCL-NACL-NASULF-NA ASC-C 100 G PO SOLR: 1.0000 | Freq: Once | ORAL | Status: DC

## 2013-04-01 NOTE — Telephone Encounter (Signed)
  04/01/2013   RE: Tommy Morse DOB: 1950-04-30 MRN: [id number]   Dear Dr. Carles Collet,    We have scheduled the above patient for an endoscopic procedure. Our records show that he is on anticoagulation therapy.   Please advise as to how long the patient may come off his therapy of Plavix prior to the procedure, which is scheduled for 04/14/13.  Sincerely,    Marlon Pel, CMA

## 2013-04-01 NOTE — Patient Instructions (Signed)
You have been scheduled for a colonoscopy with propofol. Please follow written instructions given to you at your visit today.  Please pick up your prep kit at the pharmacy within the next 1-3 days. If you use inhalers (even only as needed), please bring them with you on the day of your procedure. Your physician has requested that you go to www.startemmi.com and enter the access code given to you at your visit today. This web site gives a general overview about your procedure. However, you should still follow specific instructions given to you by our office regarding your preparation for the procedure.  You will be contacted by our office prior to your procedure for directions on holding your Plavix.  If you do not hear from our office 1 week prior to your scheduled procedure, please call [phone] to discuss.   Thank you for choosing me and [facility].  Pricilla Riffle. Dagoberto Ligas., MD., [facility]

## 2013-04-01 NOTE — Telephone Encounter (Signed)
3-5 days prior should be fine.

## 2013-04-01 NOTE — Progress Notes (Signed)
    History of Present Illness: This is a 63 year old male with GERD. He underwent upper endoscopy in December which was normal with an empiric dilation performed for symptoms of dysphagia. His proton as was increased to twice daily and his reflux symptoms and dysphagia have substantially improved. He still notes occasional mild solid food dysphagia particularly when taking large bites of food. Overall his symptoms have substantially improved. He has surgery scheduled later this month for spine stimulator placement. He would like a screening colonoscopy performed and he states that it has been over 10 years since his last colonoscopy which he reports as normal.   Current Medications, Allergies, Past Medical History, Past Surgical History, Family History and Social History were reviewed in [employer] record.  Physical Exam: General: Well developed , well nourished, no acute distress Head: Normocephalic and atraumatic Eyes:  sclerae anicteric, EOMI Ears: Normal auditory acuity Mouth: No deformity or lesions Lungs: Clear throughout to auscultation Heart: Regular rate and rhythm; no murmurs, rubs or bruits Abdomen: Soft, non tender and non distended. No masses, hepatosplenomegaly or hernias noted. Normal Bowel sounds Rectal: Deferred to colonoscopy  Musculoskeletal: Symmetrical with no gross deformities  Pulses:  Normal pulses noted Extremities: No clubbing, cyanosis, edema or deformities noted Neurological: Alert oriented x 4, grossly nonfocal Psychological:  Alert and cooperative. Normal mood and affect  Assessment and Recommendations:  1. GERD. Very mild solid food dysphagia that has improved with better management of his reflux. Continue standard antireflux measures and pantoprazole 40 mg twice a day. Modify diet to avoid or minimize foods that lead to dysphagia. If his dysphagia symptoms worsen consider barium swallow study with a tablet and an esophageal  manometry.  2. Colorectal cancer screening, average risk. Schedule colonoscopy off Plavix for 5 days. Patient like to try to arrange this before the end of the month when he changes health insurance and prior to his back stimulator placement. He was recently cleared to hold Plavix for his endoscopy in December and he has been cleared to hold Plavix for his spine stimulator placement scheduled for later this month. The risks, benefits, and alternatives to colonoscopy with possible biopsy and possible polypectomy were discussed with the patient and they consent to proceed.

## 2013-04-01 NOTE — Telephone Encounter (Signed)
Patient notified to hold Plavix 5 days before his procedure per Dr. Carles Collet. Pt verbalized understanding.

## 2013-04-13 ENCOUNTER — Telehealth: Payer: Self-pay | Admitting: Gastroenterology

## 2013-04-13 NOTE — Telephone Encounter (Signed)
Spoke with the patient, questions answered.  His procedure time has been moved to 2pm, he will arrive at 1pm.

## 2013-04-14 ENCOUNTER — Encounter: Payer: Self-pay | Admitting: Gastroenterology

## 2013-04-14 ENCOUNTER — Ambulatory Visit (AMBULATORY_SURGERY_CENTER): Payer: BC Managed Care – PPO | Admitting: Gastroenterology

## 2013-04-14 VITALS — BP 112/65 | HR 55 | Temp 97.1°F | Resp 10 | Ht 73.0 in | Wt 206.0 lb

## 2013-04-14 DIAGNOSIS — D126 Benign neoplasm of colon, unspecified: Secondary | ICD-10-CM

## 2013-04-14 DIAGNOSIS — Z1211 Encounter for screening for malignant neoplasm of colon: Secondary | ICD-10-CM

## 2013-04-14 LAB — GLUCOSE, CAPILLARY
GLUCOSE-CAPILLARY: 147 mg/dL — AB (ref 70–99)
GLUCOSE-CAPILLARY: 110 mg/dL — AB (ref 70–99)

## 2013-04-14 MED ORDER — SODIUM CHLORIDE 0.9 % IV SOLN: 500.0000 mL | INTRAVENOUS | Status: DC

## 2013-04-14 NOTE — Progress Notes (Signed)
Called to room to assist during endoscopic procedure.  Patient ID and intended procedure confirmed with present staff. Received instructions for my participation in the procedure from the performing physician.  

## 2013-04-14 NOTE — Patient Instructions (Signed)
YOU HAD AN ENDOSCOPIC PROCEDURE TODAY AT THE [facility]: Refer to the procedure report that was given to you for any specific questions about what was found during the examination.  If the procedure report does not answer your questions, please call your gastroenterologist to clarify.  If you requested that your care partner not be given the details of your procedure findings, then the procedure report has been included in a sealed envelope for you to review at your convenience later.  YOU SHOULD EXPECT: Some feelings of bloating in the abdomen. Passage of more gas than usual.  Walking can help get rid of the air that was put into your GI tract during the procedure and reduce the bloating. If you had a lower endoscopy (such as a colonoscopy or flexible sigmoidoscopy) you may notice spotting of blood in your stool or on the toilet paper. If you underwent a bowel prep for your procedure, then you may not have a normal bowel movement for a few days.  DIET: Your first meal following the procedure should be a light meal and then it is ok to progress to your normal diet.  A half-sandwich or bowl of soup is an example of a good first meal.  Heavy or fried foods are harder to digest and may make you feel nauseous or bloated.  Likewise meals heavy in dairy and vegetables can cause extra gas to form and this can also increase the bloating.  Drink plenty of fluids but you should avoid alcoholic beverages for 24 hours.  ACTIVITY: Your care partner should take you home directly after the procedure.  You should plan to take it easy, moving slowly for the rest of the day.  You can resume normal activity the day after the procedure however you should NOT DRIVE or use heavy machinery for 24 hours (because of the sedation medicines used during the test).    SYMPTOMS TO REPORT IMMEDIATELY: A gastroenterologist can be reached at any hour.  During normal business hours, 8:30 AM to 5:00 PM Monday through Friday,  call [phone].  After hours and on weekends, please call the GI answering service at [phone] who will take a message and have the physician on call contact you.   Following lower endoscopy (colonoscopy or flexible sigmoidoscopy):  Excessive amounts of blood in the stool  Significant tenderness or worsening of abdominal pains  Swelling of the abdomen that is new, acute  Fever of 100F or higher  FOLLOW UP: If any biopsies were taken you will be contacted by phone or by letter within the next 1-3 weeks.  Call your gastroenterologist if you have not heard about the biopsies in 3 weeks.  Our staff will call the home number listed on your records the next business day following your procedure to check on you and address any questions or concerns that you may have at that time regarding the information given to you following your procedure. This is a courtesy call and so if there is no answer at the home number and we have not heard from you through the emergency physician on call, we will assume that you have returned to your regular daily activities without incident.  SIGNATURES/CONFIDENTIALITY: You and/or your care partner have signed paperwork which will be entered into your electronic medical record.  These signatures attest to the fact that that the information above on your After Visit Summary has been reviewed and is understood.  Full responsibility of the confidentiality of this   discharge information lies with you and/or your care-partner.   Resume Plavix today.   Diverticulosis, polyp, and high fiber diet and hemorrhoid information given.  Next colonoscopy to most likely be 5 years-2020, Dr. Fuller Plan will advise you in a letter after pathology is reviewed.

## 2013-04-14 NOTE — Progress Notes (Signed)
Report to pacu rn, vss, bbs=clear 

## 2013-04-14 NOTE — Op Note (Signed)
[facility]  [address] [place], [address]   COLONOSCOPY PROCEDURE REPORT PATIENT: Tommy Morse, Tommy Morse  MR#: [id number] BIRTHDATE: 1950-08-20 , 62  yrs. old GENDER: Male ENDOSCOPIST: Ladene Artist, MD, [facility] REFERRED BY: PROCEDURE DATE:  04/14/2013 PROCEDURE:   Colonoscopy with biopsy and snare polypectomy First Screening Colonoscopy - Avg.  risk and is 50 yrs.  old or older - No.  Prior Negative Screening - Now for repeat screening. N/A  History of Adenoma - Now for follow-up colonoscopy & has been > or = to 3 yrs.  N/A  Polyps Removed Today? Yes. ASA CLASS:   Class II INDICATIONS:average risk screening. MEDICATIONS: MAC sedation, administered by CRNA and propofol (Diprivan) 230mg  IV DESCRIPTION OF PROCEDURE:   After the risks benefits and alternatives of the procedure were thoroughly explained, informed consent was obtained.  A digital rectal exam revealed no abnormalities of the rectum.   The LB [id number]  endoscope was introduced through the anus and advanced to the cecum, which was identified by both the appendix and ileocecal valve. No adverse events experienced.   The quality of the prep was adequate, using MoviPrep  The instrument was then slowly withdrawn as the colon was fully examined.  COLON FINDINGS: A sessile polyp measuring 4 mm in size was found in the ascending colon.  A polypectomy was performed with cold forceps.  The resection was complete and the polyp tissue was completely retrieved.   A sessile polyp measuring 5 mm in size was found in the transverse colon.  A polypectomy was performed with a cold snare.  The resection was complete and the polyp tissue was completely retrieved.   A sessile polyp measuring 5 mm in size was found in the descending colon.  A polypectomy was performed with a cold snare.  The resection was complete and the polyp tissue was completely retrieved.   Moderate diverticulosis was noted in the sigmoid  colon.   The colon was otherwise normal.  There was no diverticulosis, inflammation, polyps or cancers unless previously stated.  Retroflexed views revealed small internal hemorrhoids. The time to cecum=2 minutes 53 seconds.  Withdrawal time=15 minutes 05 seconds.  The scope was withdrawn and the procedure completed. COMPLICATIONS: There were no complications. ENDOSCOPIC IMPRESSION: 1.   Sessile polyp measuring 4 mm in the ascending colon; polypectomy performed with cold forceps 2.   Sessile polyp measuring 5 mm in the transverse colon; polypectomy performed with a cold snare 3.   Sessile polyp measuring 5 mm in the descending colon; polypectomy performed with a cold snare 4.   Moderate diverticulosis in the sigmoid colon 5.   Small internal hemorrhoids RECOMMENDATIONS: 1.  Await pathology results 2.  Repeat colonoscopy in 5 years if polyp(s) adenomatous; otherwise 10 years 3.  Resume Plavix today eSigned:  Ladene Artist, MD, [facility] 04/14/2013 1:29 PM

## 2013-04-15 ENCOUNTER — Telehealth: Payer: Self-pay | Admitting: *Deleted

## 2013-04-15 NOTE — Telephone Encounter (Signed)
Message left

## 2013-04-20 ENCOUNTER — Other Ambulatory Visit: Payer: Self-pay | Admitting: Internal Medicine

## 2013-04-20 ENCOUNTER — Encounter: Payer: Self-pay | Admitting: Gastroenterology

## 2013-05-04 ENCOUNTER — Encounter: Payer: Self-pay | Admitting: Internal Medicine

## 2013-05-04 ENCOUNTER — Ambulatory Visit (INDEPENDENT_AMBULATORY_CARE_PROVIDER_SITE_OTHER): Payer: 59 | Admitting: Internal Medicine

## 2013-05-04 VITALS — BP 106/72 | HR 69 | Ht 73.0 in | Wt 205.0 lb

## 2013-05-04 DIAGNOSIS — I2510 Atherosclerotic heart disease of native coronary artery without angina pectoris: Secondary | ICD-10-CM

## 2013-05-04 DIAGNOSIS — E785 Hyperlipidemia, unspecified: Secondary | ICD-10-CM

## 2013-05-04 DIAGNOSIS — I10 Essential (primary) hypertension: Secondary | ICD-10-CM

## 2013-05-04 NOTE — Progress Notes (Signed)
PCP:  [name], MD  The patient presents today for routine cardiology followup.  Since his last visit to our office, he reports doing very well.  His primary limitation remains sciatica/ leg pain.  This pain is quite bothersome to him.  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  Today, he denies symptoms of palpitations, exertional chest pain, shortness of breath or other concerns.  He has had a few atypical chest pains in the past but none is the past few weeks.  Past Medical History  Diagnosis Date  . Hypertension   . Hyperlipemia, mixed     intolerance to statins  . LVH (left ventricular hypertrophy)   . Hypertriglyceridemia     Triglycerides 741 March '13 - Initiated on Gemfibrozil  . Diabetes mellitus     A1C 7.2 Aug '13, microalbumin slightly elevated  . Coronary artery disease     CABG - x6 - 2008, - Lexiscan myoview 05/14/11 no inducible ischemia, EF 68%  . 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 Left sided sciatica 2012    neuropathic meds no help/side effects  . Left hip pain 2012    Dr. Ramos--injections no help---long record of specialists ([facility] did CT lumbar myelogram and EMG/NCS.  Neurology did MRI L spine), still no final explanation and spinal stimulator may be ultimate treatment  . BPH (benign prostatic hypertrophy)   . History of balanitis     pt uncircumcised  . GERD (gastroesophageal reflux disease)   . Stroke     TIA- 2012   Past Surgical History  Procedure Laterality Date  . Cardiac catheterization  04/19/2206    EF 60-65%  . 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 Laminectomy  06/2009    L3-L4  . Back surgery  2013  . Prostate surgery      ?TURP--need old records    Current Outpatient Prescriptions  Medication Sig Dispense Refill  . clopidogrel (PLAVIX) 75 MG tablet TAKE 1 TABLET (75 MG TOTAL) BY MOUTH DAILY.  90  tablet  1  . gemfibrozil (LOPID) 600 MG tablet TAKE 1 TABLET (600 MG TOTAL) BY MOUTH 2 (TWO) TIMES DAILY BEFORE A MEAL.  60 tablet  3  . glipiZIDE (GLUCOTROL) 5 MG tablet Take 1 tablet (5 mg total) by mouth 2 (two) times daily before a meal.  180 tablet  6  . hydrochlorothiazide (HYDRODIURIL) 25 MG tablet TAKE 1 TABLET (25 MG TOTAL) BY MOUTH DAILY.  30 tablet  3  . metFORMIN (GLUCOPHAGE) 1000 MG tablet Take 1 tablet (1,000 mg total) by mouth 2 (two) times daily with a meal.  180 tablet  3  . metoprolol (LOPRESSOR) 50 MG tablet TAKE 1 AND 1/2 TABLETS BY MOUTH TWICE A DAY  270 tablet  2  . Multiple Vitamin (MULTIVITAMIN) capsule Take 1 capsule by mouth daily.      . pantoprazole (PROTONIX) 40 MG tablet Take 1 tablet (40 mg total) by mouth 2 (two) times daily.  180 tablet  3  . ramipril (ALTACE) 10 MG capsule TAKE 1 CAPSULE (10 MG TOTAL) BY MOUTH DAILY.  30 capsule  3  . vitamin B-12 (CYANOCOBALAMIN) 100 MCG tablet Take 50 mcg by mouth daily.      Marland Kitchen ZETIA 10 MG tablet TAKE 1 TABLET (10 MG TOTAL) BY MOUTH DAILY.  90 tablet  0  . [DISCONTINUED] gabapentin (  NEURONTIN) 300 MG capsule 600 mg. increase to 1 tab three times a day as directed       No current facility-administered medications for this visit.    Allergies  Allergen Reactions  . Amiodarone Other (See Comments)    "Made me Dr. Jack Quarto and [name]."  . Amoxicillin-Pot Clavulanate Rash  . Statins Other (See Comments)    "Puts me in a funk."    History   Social History  . Marital Status: Married    Spouse Name: N/A    Number of Children: N/A  . Years of Education: N/A   Occupational History  . pastor     [employer]   Social History Main Topics  . Smoking status: Never Smoker   . Smokeless tobacco: Never Used  . Alcohol Use: No  . Drug Use: No  . Sexual Activity: Not on file   Other Topics Concern  . Not on file   Social History Narrative   Married, 2 sons and 1 daughter.  Two grandsons.   Orig from  [place].  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   No T/A/Ds.    Family History  Problem Relation Age of Onset  . Heart disease Mother     valvular heart disease / Strong Family History  . Heart attack Mother   . Heart attack Father   . Hypertension Brother   . Colon cancer Neg Hx   . Esophageal cancer Neg Hx   . Stomach cancer Neg Hx   . Rectal cancer Neg Hx     Physical Exam: Filed Vitals:   05/04/13 1344  BP: 106/72  Pulse: 69  Height: 6\' 1"  (1.854 m)  Weight: 205 lb (92.987 kg)    GEN- The patient is well appearing, alert and oriented x 3 today.   Head- normocephalic, atraumatic Eyes-  Sclera clear, conjunctiva pink Ears- hearing intact Oropharynx- clear Neck- supple, no JVP Lymph- no cervical lymphadenopathy Lungs- Clear to ausculation bilaterally, normal work of breathing Heart- Regular rate and rhythm, no murmurs, rubs or gallops, PMI not laterally displaced GI- soft, NT, ND, + BS Extremities- no clubbing, cyanosis, or edema  ekg today reveals sinus rhythm 69 bpm, PR 222, inferior infarction, no ischemic changes  Assessment and Plan:  1. Cad Stable No change required today  2. HL Has not tolerated statins the past I will refer to [facility] for lipid management  3. HTN Stable No change required today  4. Prior CVA Carotid dopplers are reviewed No changes at this time  Return to see me in 1 year

## 2013-05-04 NOTE — Patient Instructions (Signed)
Your physician wants you to follow-up in: 12 months with Dr Vallery Ridge will receive a reminder letter in the mail two months in advance. If you don't receive a letter, please call our office to schedule the follow-up appointment.   You have been referred to [facility], Pharm D in lipid clinic

## 2013-05-12 ENCOUNTER — Ambulatory Visit (INDEPENDENT_AMBULATORY_CARE_PROVIDER_SITE_OTHER): Payer: 59 | Admitting: Pharmacist

## 2013-05-12 VITALS — Wt 205.0 lb

## 2013-05-12 DIAGNOSIS — Z79899 Other long term (current) drug therapy: Secondary | ICD-10-CM

## 2013-05-12 DIAGNOSIS — E785 Hyperlipidemia, unspecified: Secondary | ICD-10-CM

## 2013-05-12 MED ORDER — FENOFIBRATE 160 MG PO TABS: ORAL_TABLET | ORAL | Status: DC

## 2013-05-12 NOTE — Patient Instructions (Signed)
1.  Stop gemfibrozil today. 2.  Start Fenofibrate tomorrow, at 160 mg once daily with largest meal the day.   3.  In 4 weeks, start Livalo 1 mg daily (take 1/2 of the 2 mg tablets I gave you as samples). 4.  In 3 months recheck blood work (liver / cholesterol)  - 08/17/13 (fasting labs); see Ysidro Evert the next day 08/18/13 at 9:00 AM

## 2013-05-12 NOTE — Assessment & Plan Note (Addendum)
Patient and I discussed benefits of statins, and also safety concerns of using gemfibrozil vs fenofibrate if needing to use a statin.  Also explained that fenofibrate can lower LDL (also safer with statins), and gemfibrozil is LDL neutral.  We discussed treatment options such as Livalo 1 mg qd (extremely well tolerated), Crestor 10 mg once weekly (tolerated well), or trying to enroll him into PCSK-9 inhibitor study given he had failed so many statins.  Patient does not want to do Crestor given he has failed this at "daily" doses, but is willing to try Livalo.  If Livalo fails, he is willing to do the SQ study drug.  He is aware it is SQ and also placebo controlled.  Will change from gemfibrozil to fenofibrate 160 mg qd today, and in 4 weeks start Livalo at just 1 mg qd (gave samples today of 2 mg pills and he will take 1/2 tablet daily).  Patient will hopefully be able to get LDL to at least < 100, and preferably < 70 with fenofibrate, Zetia, and Livalo.  If he can tolerate Livalo, may be able to d/c Zetia.  If can't tolerate Livalo, will try to enroll into PCSK-9 study.  Will see patient back in 3 months. Plan: 1.  Stop gemfibrozil today. 2.  Start Fenofibrate tomorrow, at 160 mg once daily with largest meal the day.   3.  In 4 weeks, start Livalo 1 mg daily (gave samples of Livalo 2 mg to last until next visit) 4.  In 3 months recheck blood work (liver / cholesterol)  - 08/17/13 (fasting labs); see Ysidro Evert the next day 08/18/13 at 9:00 AM

## 2013-05-12 NOTE — Progress Notes (Signed)
Patient is pleasant 63 y.o. WM referred to lipid clinic by Dr. Rayann Heman given h/o elevated LDL and TG, and unable to tolerate statins.  Patient has a h/o CABG x 6, CVA, and diabetes.  He is currently only on Zetia 10 mg qd and gemfibrozil 600 mg bid for his cholesterol.  Gemfibrozil  was started 04/2011 due to TG of 700 mg/dL while in the hospital.  He has never been treated with fenofibrate.  He took Lovaza for years but stopped it as he didn't feel it was working well, and got tired to taking so many pills.    RF:  CABG x 6, CVA, diabetes, age, low HDL - LDL goal <70, non-HDL goal <100 Meds:  Zetia 10 mg qd, Gemfibrozil 600 mg bid Intolerant:  Lipitor, Zocor, Crestor, Pravachol ("put me in a funk" - caused him to be fatigued and anxious he tells me)  Family history:  Mother died at 47 due to heart disease.  Father died at 5 y.o. With heart diease (h/o CABG and MI).  Uncle died from pancreatitis (didn't drink heavily). Social history:  Doesn't use alcohol.  Doesn't use tobacco.  Is a Company secretary. Diet:  Eats a low fat diet already.  Reviewed this during visit.  Labs:   11/2012:  TG 358, TC 243, LDL 133, HDL 38, A1C 8.6, LFTs normal (Zetia 10 mg qd, Gemfibrozil 600 mg bid)  Current Outpatient Prescriptions  Medication Sig Dispense Refill  . clopidogrel (PLAVIX) 75 MG tablet TAKE 1 TABLET (75 MG TOTAL) BY MOUTH DAILY.  90 tablet  1  . gemfibrozil (LOPID) 600 MG tablet TAKE 1 TABLET (600 MG TOTAL) BY MOUTH 2 (TWO) TIMES DAILY BEFORE A MEAL.  60 tablet  3  . glipiZIDE (GLUCOTROL) 5 MG tablet Take 1 tablet (5 mg total) by mouth 2 (two) times daily before a meal.  180 tablet  6  . hydrochlorothiazide (HYDRODIURIL) 25 MG tablet TAKE 1 TABLET (25 MG TOTAL) BY MOUTH DAILY.  30 tablet  3  . metFORMIN (GLUCOPHAGE) 1000 MG tablet Take 1 tablet (1,000 mg total) by mouth 2 (two) times daily with a meal.  180 tablet  3  . metoprolol (LOPRESSOR) 50 MG tablet TAKE 1 AND 1/2 TABLETS BY MOUTH TWICE A DAY  270 tablet  2   . Multiple Vitamin (MULTIVITAMIN) capsule Take 1 capsule by mouth daily.      . pantoprazole (PROTONIX) 40 MG tablet Take 1 tablet (40 mg total) by mouth 2 (two) times daily.  180 tablet  3  . ramipril (ALTACE) 10 MG capsule TAKE 1 CAPSULE (10 MG TOTAL) BY MOUTH DAILY.  30 capsule  3  . vitamin B-12 (CYANOCOBALAMIN) 100 MCG tablet Take 50 mcg by mouth daily.      Marland Kitchen ZETIA 10 MG tablet TAKE 1 TABLET (10 MG TOTAL) BY MOUTH DAILY.  90 tablet  0  . [DISCONTINUED] gabapentin (NEURONTIN) 300 MG capsule 600 mg. increase to 1 tab three times a day as directed       No current facility-administered medications for this visit.   Allergies  Allergen Reactions  . Amiodarone Other (See Comments)    "Made me Dr. Jack Quarto and [name]."  . Amoxicillin-Pot Clavulanate Rash  . Statins Other (See Comments)    "Puts me in a funk."   Family History  Problem Relation Age of Onset  . Heart disease Mother     valvular heart disease / Strong Family History  . Heart attack Mother   .  Heart attack Father   . Hypertension Brother   . Colon cancer Neg Hx   . Esophageal cancer Neg Hx   . Stomach cancer Neg Hx   . Rectal cancer Neg Hx

## 2013-05-20 ENCOUNTER — Telehealth: Payer: Self-pay | Admitting: Pharmacist

## 2013-05-20 MED ORDER — GEMFIBROZIL 600 MG PO TABS: 600.0000 mg | ORAL_TABLET | Freq: Two times a day (BID) | ORAL | Status: DC

## 2013-05-20 NOTE — Telephone Encounter (Signed)
Patient called to let me know that after 4 days of taking fenofibrate 160 mg qd, his joints in his legs started to ache.  He stopped the medication, and 2-3 days later the pain went away.  He was able to tolerate gemfibrozil before, but tried to use fenofibrate given it's LDL lowering effects and safer fibrate to use regarding interactions.  Given we plan on using Livalo 1 mg in the future, Gemfibrozil would be safe with this, so will change back to gemfibrozil.    RF: CABG x 6, CVA, diabetes, age, low HDL - LDL goal <70, non-HDL goal <100  Meds: Zetia 10 mg qd; stopped fenofibrate a few days ago  Intolerant: Lipitor, Zocor, Crestor, Pravachol ("put me in a funk" - caused him to be fatigued and anxious he tells me), Fenofibrate (muscle aches)  Patient notified to stay off fenofibrate, restart gemfibrozil 600 mg bid, continue Zetia 10 mg qd.  In 4 weeks, he will start taking Livalo 1 mg qd as we discussed at our lipid clinic office visit.  Will recheck blood work in 3 months.  Patient to call if having more problems in the future.  [facility]

## 2013-06-19 ENCOUNTER — Other Ambulatory Visit: Payer: Self-pay | Admitting: Nurse Practitioner

## 2013-06-29 ENCOUNTER — Other Ambulatory Visit (HOSPITAL_COMMUNITY): Payer: Self-pay | Admitting: Internal Medicine

## 2013-07-28 ENCOUNTER — Ambulatory Visit (INDEPENDENT_AMBULATORY_CARE_PROVIDER_SITE_OTHER): Payer: 59 | Admitting: Nurse Practitioner

## 2013-07-28 ENCOUNTER — Encounter: Payer: Self-pay | Admitting: Nurse Practitioner

## 2013-07-28 ENCOUNTER — Other Ambulatory Visit: Payer: Self-pay | Admitting: Internal Medicine

## 2013-07-28 VITALS — BP 131/76 | HR 96 | Temp 97.4°F | Resp 18 | Ht 73.0 in | Wt 204.0 lb

## 2013-07-28 DIAGNOSIS — E119 Type 2 diabetes mellitus without complications: Secondary | ICD-10-CM

## 2013-07-28 DIAGNOSIS — R739 Hyperglycemia, unspecified: Secondary | ICD-10-CM

## 2013-07-28 DIAGNOSIS — R7309 Other abnormal glucose: Secondary | ICD-10-CM

## 2013-07-28 LAB — URINALYSIS, ROUTINE W REFLEX MICROSCOPIC
Bilirubin Urine: NEGATIVE
HGB URINE DIPSTICK: NEGATIVE
Ketones, ur: NEGATIVE
LEUKOCYTES UA: NEGATIVE
Nitrite: NEGATIVE
SPECIFIC GRAVITY, URINE: 1.025 (ref 1.000–1.030)
Total Protein, Urine: NEGATIVE
UROBILINOGEN UA: 0.2 (ref 0.0–1.0)
Urine Glucose: 100 — AB
pH: 5.5 (ref 5.0–8.0)

## 2013-07-28 LAB — COMPREHENSIVE METABOLIC PANEL
ALT: 27 U/L (ref 0–53)
AST: 22 U/L (ref 0–37)
Albumin: 4.3 g/dL (ref 3.5–5.2)
Alkaline Phosphatase: 71 U/L (ref 39–117)
BILIRUBIN TOTAL: 0.9 mg/dL (ref 0.2–1.2)
BUN: 37 mg/dL — ABNORMAL HIGH (ref 6–23)
CO2: 27 mEq/L (ref 19–32)
CREATININE: 2 mg/dL — AB (ref 0.4–1.5)
Calcium: 9.9 mg/dL (ref 8.4–10.5)
Chloride: 92 mEq/L — ABNORMAL LOW (ref 96–112)
GFR: 35.32 mL/min — AB (ref >60.00)
Glucose, Bld: 245 mg/dL — ABNORMAL HIGH (ref 70–99)
Potassium: 4.5 mEq/L (ref 3.5–5.1)
SODIUM: 131 meq/L — AB (ref 135–145)
TOTAL PROTEIN: 7.9 g/dL (ref 6.0–8.3)

## 2013-07-28 LAB — HEMOGLOBIN A1C: HEMOGLOBIN A1C: 10.2 % — AB (ref 4.6–6.5)

## 2013-07-28 LAB — CBC
HEMATOCRIT: 42.8 % (ref 39.0–52.0)
Hemoglobin: 14.5 g/dL (ref 13.0–17.0)
MCHC: 33.8 g/dL (ref 30.0–36.0)
MCV: 88.9 fl (ref 78.0–100.0)
Platelets: 291 10*3/uL (ref 150.0–400.0)
RBC: 4.82 Mil/uL (ref 4.22–5.81)
RDW: 14.9 % (ref 11.5–15.5)
WBC: 7.3 10*3/uL (ref 4.0–10.5)

## 2013-07-28 LAB — MICROALBUMIN / CREATININE URINE RATIO
Creatinine,U: 195.1 mg/dL
MICROALB/CREAT RATIO: 1.3 mg/g (ref 0.0–30.0)
Microalb, Ur: 2.5 mg/dL — ABNORMAL HIGH (ref 0.0–1.9)

## 2013-07-28 MED ORDER — GLIPIZIDE 5 MG PO TABS: ORAL_TABLET | ORAL | Status: DC

## 2013-07-28 NOTE — Progress Notes (Signed)
Diabetes Follow-Up Visit Chief Complaint:  Elevated blood sugar, feeling fatigued & "warm" for several days. Chief Complaint  Patient presents with  . Hyperglycemia    sugar at 343     Exam  BMI:  Body mass index is 26.92 kg/(m^2).   Weight changes:  1 pound General Appearance:  alert, oriented, no acute distress and well nourished Mood/Affect:  normal Lungs: clear BS bilat Heart: RRR, no murmur ENT: eyelids & lashes clear, conjunctiva not injected; posterior pharynx nml, tonsils +1 no exudate; bilat TM clear, bones visible Lymph nodes: no cervical or supraclavicular LAD.  HPI:  Just returned from [place]. Had some nasal drainage for a few days. Feeling warm & fatigued. Checked bs at pharmacy- 343. Does not monitor BS at home-machine not working.      Lab Results  Component Value Date   HGBA1C 10.2* 07/28/2013    Lab Results  Component Value Date   MICROALBUR 2.5* 07/28/2013    Lab Results  Component Value Date   CHOL 243* 12/11/2012   HDL 38* 12/11/2012   LDLCALC 133* 12/11/2012   LDLDIRECT 167.1 07/27/2011   TRIG 358* 12/11/2012   CHOLHDL 6.4 12/11/2012      Assessment: 1. Diabetes Elevated BS May have viral illness. - CBC - Hemoglobin A1c - Comprehensive metabolic panel - Microalbumin / creatinine urine ratio - glipiZIDE (GLUCOTROL) 5 MG tablet; Take 1 1/2 T po early in day (with largest meal-breakfast or lunch) and 1T po in evening  Dispense: 180 tablet; Refill: 6 - Urine culture - Urinalysis, Routine w reflex microscopic  See pt instructions.

## 2013-07-28 NOTE — Assessment & Plan Note (Addendum)
Pt reports CBG of 343 today at drugstore. He does not check sugar at home. Has been feeling "warm & fatigued" in last few days. Nml PE. Labs today: A1C, CMET, UA, urine Cx, CBC Will increase glipizide to 7.5 mg in am & continue w/5mg  in pm. Discussed diet changes: stop drinking gatorade, cut back or cut out refined flour & sugar. Increase fiber from whole food:fruits, vegetables, whole grains. F/u based on labs.

## 2013-07-28 NOTE — Patient Instructions (Signed)
Our office will call with results of labs & necessary follow up. Increase glipizide to 1 1/2 tabs during day & continue with 1 T in evening.  Consider making some diet changes: cut back or limit refined flour and sugar. Eat all the fresh fruit & vegetables you want. Make a goal of eating 50 grams fiber daily from whole foods: fruits & veggies, bean & peas, sweet potatoes, whole grains. Fiber from a bottle or sprinkled on prepared food does not count because you are not getting the healing benefits from fruits & vegetables & whole grains. Consider reading Eat to Live by Excell Seltzer and incorporating the principles into daily diet. Also, taking a 15 minute walk after meals significantly helps with sugar metabolism.  Nice to meet you!  Fiber Content in Foods Drinking plenty of fluids and consuming foods high in fiber can help with constipation. See the list below for the fiber content of some common foods. Starches and Grains / Dietary Fiber (g)  Cheerios, 1 cup / 3 g  Kellogg's Corn Flakes, 1 cup / 0.7 g  Rice Krispies, 1  cup / 0.3 g  Quaker Oat Life Cereal,  cup / 2.1 g  Oatmeal, instant (cooked),  cup / 2 g  Kellogg's Frosted Mini Wheats, 1 cup / 5.1 g  Rice, brown, long-grain (cooked), 1 cup / 3.5 g  Rice, white, long-grain (cooked), 1 cup / 0.6 g  Macaroni, cooked, enriched, 1 cup / 2.5 g Legumes / Dietary Fiber (g)  Beans, baked, canned, plain or vegetarian,  cup / 5.2 g  Beans, kidney, canned,  cup / 6.8 g  Beans, pinto, dried (cooked),  cup / 7.7 g  Beans, pinto, canned,  cup / 5.5 g Breads and Crackers / Dietary Fiber (g)  Graham crackers, plain or honey, 2 squares / 0.7 g  Saltine crackers, 3 squares / 0.3 g  Pretzels, plain, salted, 10 pieces / 1.8 g  Bread, whole-wheat, 1 slice / 1.9 g  Bread, white, 1 slice / 0.7 g  Bread, raisin, 1 slice / 1.2 g  Bagel, plain, 3 oz / 2 g  Tortilla, flour, 1 oz / 0.9 g  Tortilla, corn, 1 small / 1.5 g  Bun,  hamburger or hotdog, 1 small / 0.9 g Fruits / Dietary Fiber (g)  Apple, raw with skin, 1 medium / 4.4 g  Applesauce, sweetened,  cup / 1.5 g  Banana,  medium / 1.5 g  Grapes, 10 grapes / 0.4 g  Orange, 1 small / 2.3 g  Raisin, 1.5 oz / 1.6 g  Melon, 1 cup / 1.4 g Vegetables / Dietary Fiber (g)  Green beans, canned,  cup / 1.3 g  Carrots (cooked),  cup / 2.3 g  Broccoli (cooked),  cup / 2.8 g  Peas, frozen (cooked),  cup / 4.4 g  Potatoes, mashed,  cup / 1.6 g  Lettuce, 1 cup / 0.5 g  Corn, canned,  cup / 1.6 g  Tomato,  cup / 1.1 g Document Released: 07/01/2006 Document Revised: 05/07/2011 Document Reviewed: 08/26/2006 Exit[employer] Patient Information 2014 [place], [place].

## 2013-07-30 ENCOUNTER — Telehealth: Payer: Self-pay | Admitting: Family Medicine

## 2013-07-30 ENCOUNTER — Encounter: Payer: Self-pay | Admitting: Family Medicine

## 2013-07-30 ENCOUNTER — Telehealth: Payer: Self-pay

## 2013-07-30 LAB — URINE CULTURE
Colony Count: NO GROWTH
ORGANISM ID, BACTERIA: NO GROWTH

## 2013-07-30 NOTE — Telephone Encounter (Signed)
Stop metformin.  Check your glucose twice per day (once when fasting in the morning and once 2 hours after your largest meal of the day).  Continue glipizide 5mg , 1 tab twice a day.  Start levemir insulin at 10 U subcutaneously at bedtime.  Arrange f/u sometime during the week of June 22-26.  Bring glucose numbers to this appointment.

## 2013-07-30 NOTE — Telephone Encounter (Signed)
Also decided to cut hctz dose in half. Will recheck BMET at f/u in a few weeks.

## 2013-07-30 NOTE — Telephone Encounter (Signed)
Spoke with pt, in your lab notes it stated she may need insulin. I advised pt his Glipizide is now up to 12.5 but he wants to know about the insulin. Please advise.

## 2013-07-31 ENCOUNTER — Telehealth: Payer: Self-pay | Admitting: Family Medicine

## 2013-07-31 DIAGNOSIS — E119 Type 2 diabetes mellitus without complications: Secondary | ICD-10-CM

## 2013-07-31 MED ORDER — HYDROCHLOROTHIAZIDE 25 MG PO TABS: ORAL_TABLET | ORAL | Status: DC

## 2013-07-31 MED ORDER — INSULIN PEN NEEDLE 32G X 6 MM MISC: Status: DC

## 2013-07-31 MED ORDER — GLIPIZIDE 5 MG PO TABS: ORAL_TABLET | ORAL | Status: DC

## 2013-07-31 MED ORDER — INSULIN DETEMIR 100 UNIT/ML FLEXPEN: PEN_INJECTOR | SUBCUTANEOUS | Status: DC

## 2013-07-31 NOTE — Telephone Encounter (Signed)
Pt returned today for further clarifying instructions about the levemir flextouch pen we gave him yesterday. Pen needles sent to pharmacy today.

## 2013-08-05 ENCOUNTER — Telehealth: Payer: Self-pay | Admitting: Family Medicine

## 2013-08-05 NOTE — Telephone Encounter (Signed)
Spoke with Pharmacy.  Test strips verbally ordered.

## 2013-08-05 NOTE — Telephone Encounter (Signed)
Pls send rx for one touch test strips, instructions are: check glucose bid, disp #100, RF x 2.

## 2013-08-05 NOTE — Telephone Encounter (Signed)
[facility] to get RX for one touch test strips.  I don't see these in his med list or instructions for him to test glucose.  Please advise.

## 2013-08-06 ENCOUNTER — Other Ambulatory Visit: Payer: Self-pay | Admitting: Nurse Practitioner

## 2013-08-06 ENCOUNTER — Telehealth: Payer: Self-pay | Admitting: *Deleted

## 2013-08-06 NOTE — Telephone Encounter (Signed)
Patient called office about his BS readings. 6/7=281, 6/8=201, 6/9=195, 6/10=363, and 6/11=286. Patient said he is not feeling well, [place]. Patient stated that he has been out of his med since Wednesday. Please advise? Pt can be reached at [phone].

## 2013-08-06 NOTE — Telephone Encounter (Signed)
Gave patient instructions. Patient stated that he was out of the strips, but he has already picked them up from pharmacy.

## 2013-08-06 NOTE — Telephone Encounter (Signed)
Sound like we need to increase his dose of levemir to 15 Units every night at bedtime. Clarify WHICH med he is out of. [place] be there for the next few days. Let me know-thx

## 2013-08-17 ENCOUNTER — Other Ambulatory Visit: Payer: 59

## 2013-08-18 ENCOUNTER — Ambulatory Visit: Payer: 59 | Admitting: Pharmacist

## 2013-08-18 ENCOUNTER — Telehealth: Payer: Self-pay | Admitting: Pharmacist

## 2013-08-18 NOTE — Telephone Encounter (Signed)
Patient missed his lipid clinic visit today, and didn't get lipid/liver drawn yesterday, so I called patient to follow up.  He is suppose to be on Zetia, Gemfibrozil, and Livalo 1 mg qd at this time.    RF:  CABG x 6, CVA, diabetes, low HDL - LDL goal < 70, non-HDL goal < 100 Intolerant:  Lipitor, Zocor, Crestor, Pravastatin ("puts him in a funk" and caused him to feel fatigued and anxious); Fenofibrate (muscle aches)  Patient tells me that he is taking gemfibrozil and Zetia, however hasn't started Livalo 1 mg qd yet as he is trying to get his glucose under control with his PCP first.  His PCP recently d/c metformin and put him on insulin for tighter glucose control.  He would like to wait until this is under better control before starting Livalo 1 mg qd.  He sees PCP later this week, and as long as glucose control has improved, he plans on starting Livalo at that time.  He will call me after he gets started on this, and will plan on checking lipid panel and hepatic panel six weeks later, and having him follow up with me 1-2 days later.  Will await patient's phone call.

## 2013-08-21 ENCOUNTER — Encounter: Payer: Self-pay | Admitting: Family Medicine

## 2013-08-21 ENCOUNTER — Ambulatory Visit (INDEPENDENT_AMBULATORY_CARE_PROVIDER_SITE_OTHER): Payer: 59 | Admitting: Family Medicine

## 2013-08-21 ENCOUNTER — Other Ambulatory Visit: Payer: Self-pay | Admitting: Family Medicine

## 2013-08-21 VITALS — BP 116/76 | HR 76 | Temp 97.3°F | Resp 18 | Ht 72.0 in | Wt 200.0 lb

## 2013-08-21 DIAGNOSIS — IMO0001 Reserved for inherently not codable concepts without codable children: Secondary | ICD-10-CM | POA: Diagnosis not present

## 2013-08-21 DIAGNOSIS — Z23 Encounter for immunization: Secondary | ICD-10-CM | POA: Diagnosis not present

## 2013-08-21 DIAGNOSIS — E1165 Type 2 diabetes mellitus with hyperglycemia: Principal | ICD-10-CM

## 2013-08-21 MED ORDER — ZOSTER VACCINE LIVE 19400 UNT/0.65ML ~~LOC~~ SOLR: 0.6500 mL | Freq: Once | SUBCUTANEOUS | Status: DC

## 2013-08-21 MED ORDER — INSULIN DETEMIR 100 UNIT/ML FLEXPEN: PEN_INJECTOR | SUBCUTANEOUS | Status: DC

## 2013-08-21 NOTE — Progress Notes (Signed)
Pre visit review using our clinic review tool, if applicable. No additional management support is needed unless otherwise documented below in the visit note. 

## 2013-08-21 NOTE — Addendum Note (Signed)
Addended by: [name] on: 08/21/2013 09:02 AM   Modules accepted: Orders

## 2013-08-21 NOTE — Progress Notes (Signed)
OFFICE NOTE  08/21/2013  CC:  Chief Complaint  Patient presents with  . Diabetes     HPI: Patient is a 63 y.o. Caucasian male who is here for 3 wk f/u DM 2: started levemir and titrated it to 15 U qhs. Feeling much better.  Fastings 150 or so, evenings up to 250s still----but this is improved.  Recent yeast balanitis dx'd by his urologist and was put on triamcin/nystatin cream and this is improving.  He has no burning, tingling, or numbness in feet.  Pertinent PMH:  Past medical, surgical, social, and family history reviewed and no changes are noted since last office visit.  MEDS:  Outpatient Prescriptions Prior to Visit  Medication Sig Dispense Refill  . clopidogrel (PLAVIX) 75 MG tablet TAKE 1 TABLET (75 MG TOTAL) BY MOUTH DAILY.  90 tablet  1  . gemfibrozil (LOPID) 600 MG tablet Take 1 tablet (600 mg total) by mouth 2 (two) times daily before a meal.  60 tablet  5  . glipiZIDE (GLUCOTROL) 5 MG tablet 1 tab po bid  180 tablet  6  . hydrochlorothiazide (HYDRODIURIL) 25 MG tablet 1/2 tab po qd  90 tablet  1  . Insulin Detemir (LEVEMIR FLEXTOUCH) 100 UNIT/ML Pen 10 U SQ qhs  3 mL  0  . metoprolol (LOPRESSOR) 50 MG tablet TAKE 1 AND 1/2 TABLETS BY MOUTH TWICE A DAY  270 tablet  2  . Multiple Vitamin (MULTIVITAMIN) capsule Take 1 capsule by mouth daily.      . pantoprazole (PROTONIX) 40 MG tablet Take 1 tablet (40 mg total) by mouth 2 (two) times daily.  180 tablet  3  . ramipril (ALTACE) 10 MG capsule TAKE 1 CAPSULE (10 MG TOTAL) BY MOUTH DAILY.  30 capsule  3  . traMADol (ULTRAM) 50 MG tablet Take by mouth every 6 (six) hours as needed.      Marland Kitchen ZETIA 10 MG tablet TAKE 1 TABLET (10 MG TOTAL) BY MOUTH DAILY.  90 tablet  0  . Insulin Pen Needle 32G X 6 MM MISC Use with levemir pen to inject once daily  100 each  6  . vitamin B-12 (CYANOCOBALAMIN) 100 MCG tablet Take 50 mcg by mouth daily.       No facility-administered medications prior to visit.    PE: Blood pressure 116/76,  pulse 76, temperature 97.3 F (36.3 C), temperature source Temporal, resp. rate 18, height 6' (1.829 m), weight 200 lb (90.719 kg), SpO2 97.00%. Gen: Alert, well appearing.  Patient is oriented to person, place, time, and situation. Foot exam - bilateral normal; no swelling, tenderness or skin or vascular lesions. Color and temperature is normal. Sensation is intact. Peripheral pulses are palpable. Toenails are normal.   IMPRESSION AND PLAN:  1) DM 2, control improving since starting levemir, pt feeling much improved. Feet exam normal today. Increase levemir to 18 U qhs. Tdap today. Zostavax rx printed and given to patient.  An After Visit Summary was printed and given to the patient.  FOLLOW UP: 3 mo, repeat HbA1c at that time.

## 2013-08-23 ENCOUNTER — Encounter: Payer: Self-pay | Admitting: Family Medicine

## 2013-09-01 ENCOUNTER — Telehealth: Payer: Self-pay | Admitting: *Deleted

## 2013-09-01 NOTE — Telephone Encounter (Signed)
Patient would like to know if the zetia can be changed to something cheaper. His copay is now over $100.00. Please advise. Thanks, MI

## 2013-09-02 NOTE — Telephone Encounter (Signed)
I will forward to [facility] Broad, please work with the patient to find an adequate solution. He is a very nice patient and friend of mine.  Jeneen Rinks

## 2013-09-03 NOTE — Telephone Encounter (Signed)
Left message for patient to call back to discuss options. °

## 2013-09-14 ENCOUNTER — Other Ambulatory Visit (HOSPITAL_COMMUNITY): Payer: Self-pay | Admitting: Internal Medicine

## 2013-09-19 ENCOUNTER — Other Ambulatory Visit: Payer: Self-pay | Admitting: Family Medicine

## 2013-09-24 ENCOUNTER — Encounter: Payer: Self-pay | Admitting: Cardiology

## 2013-10-07 NOTE — Telephone Encounter (Signed)
RF: CABG x 6, CVA, diabetes, low HDL - LDL goal < 70, non-HDL goal < 100  Meds:  Gemfibrozil 600 mg bid Intolerant: Lipitor, Zocor, Crestor, Pravastatin ("puts him in a funk" and caused him to feel fatigued and anxious); Fenofibrate (muscle aches) Couldn't take:  Zetia as cost went up to over $100 per month with his [employer].  Patient has samples of Livalo 2 mg at home that he hasn't started yet.  He explained the cost of Zetia was too much and that he needs another option.  We discussed Livalo and also discussed Praluent given his complicated PMH and intolerances.  Advised patient to start Livalo 1 mg qd today, and to call me back in 4 weeks with status update.  If he is able to tolerate it, we will continue this and check lipid / liver 4 weeks later.  If he can't tolerate it, we can try to get him on Praluent given long list of intolerances.  If for some reason can't get him on Praluent, will enroll him into [facility] given h/o CAD, diabetes, and low HDL.

## 2013-11-10 ENCOUNTER — Other Ambulatory Visit: Payer: Self-pay | Admitting: Family Medicine

## 2013-11-10 ENCOUNTER — Telehealth: Payer: Self-pay | Admitting: Family Medicine

## 2013-11-10 NOTE — Telephone Encounter (Signed)
Pt LMOM stating that his sugar continues to creep up and is at  215 this am.  Patient wondering if he should increase his levemir or glipizide.  Please advise.

## 2013-11-10 NOTE — Telephone Encounter (Signed)
Increase levemir to 22 Units every night at bedtime.  Continue to increase by 1 unit every night until his fasting glucose is consistently in the range of 100-110.   No change in glipizide.

## 2013-11-10 NOTE — Telephone Encounter (Signed)
Pt aware of med instructions. Voiced understanding.

## 2013-11-13 ENCOUNTER — Other Ambulatory Visit: Payer: Self-pay | Admitting: Internal Medicine

## 2013-11-20 ENCOUNTER — Ambulatory Visit (INDEPENDENT_AMBULATORY_CARE_PROVIDER_SITE_OTHER): Payer: 59 | Admitting: Family Medicine

## 2013-11-20 ENCOUNTER — Encounter: Payer: Self-pay | Admitting: Family Medicine

## 2013-11-20 VITALS — BP 103/68 | HR 55 | Temp 98.0°F | Resp 16 | Ht 72.0 in | Wt 200.0 lb

## 2013-11-20 DIAGNOSIS — E782 Mixed hyperlipidemia: Secondary | ICD-10-CM

## 2013-11-20 DIAGNOSIS — E785 Hyperlipidemia, unspecified: Secondary | ICD-10-CM

## 2013-11-20 DIAGNOSIS — E1165 Type 2 diabetes mellitus with hyperglycemia: Principal | ICD-10-CM

## 2013-11-20 DIAGNOSIS — Z0000 Encounter for general adult medical examination without abnormal findings: Secondary | ICD-10-CM

## 2013-11-20 DIAGNOSIS — IMO0001 Reserved for inherently not codable concepts without codable children: Secondary | ICD-10-CM

## 2013-11-20 DIAGNOSIS — N183 Chronic kidney disease, stage 3 unspecified: Secondary | ICD-10-CM

## 2013-11-20 DIAGNOSIS — Z23 Encounter for immunization: Secondary | ICD-10-CM

## 2013-11-20 HISTORY — DX: Chronic kidney disease, stage 3 unspecified: N18.30

## 2013-11-20 LAB — BASIC METABOLIC PANEL
BUN: 27 mg/dL — AB (ref 6–23)
CHLORIDE: 100 meq/L (ref 96–112)
CO2: 26 meq/L (ref 19–32)
Calcium: 10 mg/dL (ref 8.4–10.5)
Creatinine, Ser: 1.5 mg/dL (ref 0.4–1.5)
GFR: 49.65 mL/min — ABNORMAL LOW (ref >60.00)
GLUCOSE: 162 mg/dL — AB (ref 70–99)
POTASSIUM: 4.4 meq/L (ref 3.5–5.1)
Sodium: 136 mEq/L (ref 135–145)

## 2013-11-20 LAB — HEMOGLOBIN A1C: Hgb A1c MFr Bld: 10.8 % — ABNORMAL HIGH (ref 4.6–6.5)

## 2013-11-20 LAB — LDL CHOLESTEROL, DIRECT: LDL DIRECT: 242.6 mg/dL

## 2013-11-20 LAB — LIPID PANEL
Cholesterol: 344 mg/dL — ABNORMAL HIGH (ref 0–200)
HDL: 33.4 mg/dL — ABNORMAL LOW (ref >39.00)
NonHDL: 310.6
Total CHOL/HDL Ratio: 10
Triglycerides: 283 mg/dL — ABNORMAL HIGH (ref 0.0–149.0)
VLDL: 56.6 mg/dL — AB (ref 0.0–40.0)

## 2013-11-20 MED ORDER — GLIPIZIDE ER 5 MG PO TB24: 5.0000 mg | ORAL_TABLET | Freq: Every day | ORAL | Status: DC

## 2013-11-20 MED ORDER — INSULIN DETEMIR 100 UNIT/ML FLEXPEN: PEN_INJECTOR | SUBCUTANEOUS | Status: DC

## 2013-11-20 MED ORDER — GLIPIZIDE ER 5 MG PO TB24: ORAL_TABLET | ORAL | Status: DC

## 2013-11-20 MED ORDER — PITAVASTATIN CALCIUM 2 MG PO TABS: ORAL_TABLET | ORAL | Status: DC

## 2013-11-20 NOTE — Assessment & Plan Note (Addendum)
Urine microalb/cr has been neg. Recheck BMET today.

## 2013-11-20 NOTE — Assessment & Plan Note (Signed)
Continue livalo 2mg  qd per Dr. Rayann Heman and continue gemfibrozil 600 mg bid--we'll recheck hepatic panel at next f/u visit in 4 mo.

## 2013-11-20 NOTE — Progress Notes (Signed)
OFFICE VISIT  11/20/2013  CC:  Chief Complaint  Patient presents with  . Follow-up   HPI:    Patient is a 63 y.o. Caucasian male who presents for 3 mo f/u DM 2, hyperlipidemia w/hx of statin intolerance. Was on Zetia but price was hiked and he stopped taking it.  Now takes a new med recommended by Dr. Baltazar Najjar 2mg  a few days a week.  Also still taking gemfibrozil.  Glucoses: trending down into mid 100s fasting but still near 200 a lot later in the daytime.  ROS: no abd pain, no anorexia, no jaundice, no bloating, no muscle/joint aches, no fatigue  Past Medical History  Diagnosis Date  . Hypertension   . Hyperlipemia, mixed     intolerance to statins  . LVH (left ventricular hypertrophy)   . Hypertriglyceridemia     Triglycerides 741 March '13 - Initiated on Gemfibrozil  . Diabetes mellitus     A1C 7.2 Aug '13, microalbumin slightly elevated  . Coronary artery disease     CABG - x6 - 2008, - Lexiscan myoview 05/14/11 no inducible ischemia, EF 68%  . 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 Left sided sciatica 2012    neuropathic meds no help/side effects  . Left hip pain 2012    Dr. Ramos--injections no help---long record of specialists ([facility] did CT lumbar myelogram and EMG/NCS.  Neurology did MRI L spine), still no final explanation and spinal stimulator may be ultimate treatment  . BPH (benign prostatic hypertrophy)   . History of balanitis     pt uncircumcised  . GERD (gastroesophageal reflux disease)   . History of adenomatous polyp of colon 03/2013    Recall 5 yrs  . Hypogonadism male     Testost implants --testopel-- 05/2013 (Dr. Ivory Broad [phone])  . Chronic renal insufficiency, stage III (moderate) 11/20/2013    Past Surgical History  Procedure Laterality Date  . Cardiac catheterization  04/19/06    EF 60-65%  . Coronary artery bypass graft  04/2003    X6   . Laminectomy  06/2009    L3-L4  . Back surgery  2013  . Prostate surgery      ?TURP--need old records  . Esophagogastroduodenoscopy (egd) with esophageal dilation  01/2013    EGD normal (no stricture) but dilation done for pt's dysphagia  . Colonoscopy w/ polypectomy  03/2013    Tubular adenoma; repeat 5 yrs    Outpatient Prescriptions Prior to Visit  Medication Sig Dispense Refill  . clopidogrel (PLAVIX) 75 MG tablet TAKE 1 TABLET (75 MG TOTAL) BY MOUTH DAILY.  90 tablet  1  . gemfibrozil (LOPID) 600 MG tablet TAKE 1 TABLET (600 MG TOTAL) BY MOUTH 2 (TWO) TIMES DAILY BEFORE A MEAL.  60 tablet  9  . hydrochlorothiazide (HYDRODIURIL) 25 MG tablet 1/2 tab po qd  90 tablet  1  . metoprolol (LOPRESSOR) 50 MG tablet TAKE 1 AND 1/2 TABLETS BY MOUTH TWICE A DAY  270 tablet  2  . Multiple Vitamin (MULTIVITAMIN) capsule Take 1 capsule by mouth daily.      . pantoprazole (PROTONIX) 40 MG tablet Take 1 tablet (40 mg total) by mouth 2 (two) times daily.  180 tablet  3  . ramipril (ALTACE) 10 MG capsule TAKE 1 CAPSULE (10 MG TOTAL) BY MOUTH DAILY.  30 capsule  5  . traMADol (ULTRAM) 50 MG tablet Take by mouth  every 6 (six) hours as needed.      . vitamin B-12 (CYANOCOBALAMIN) 100 MCG tablet Take 50 mcg by mouth daily.      Marland Kitchen glipiZIDE (GLUCOTROL) 5 MG tablet 1 tab po bid  180 tablet  6  . Insulin Detemir (LEVEMIR FLEXTOUCH) 100 UNIT/ML Pen 18 U SQ qhs  15 mL  6  . NOVOFINE 32G X 6 MM MISC USE AS DIRECTED  100 each  3  . ONE TOUCH ULTRA TEST test strip TEST TWICE A DAY  50 each  1  . nystatin cream (MYCOSTATIN) Apply 1 application topically 2 (two) times daily.      . pantoprazole (PROTONIX) 40 MG tablet TAKE 1 TABLET (40 MG TOTAL) BY MOUTH DAILY.  30 tablet  6  . triamcinolone cream (KENALOG) 0.1 % Apply 1 application topically 2 (two) times daily.      Marland Kitchen ZETIA 10 MG tablet TAKE 1 TABLET (10 MG TOTAL) BY MOUTH DAILY.  90 tablet  0  . zoster vaccine live, PF, (ZOSTAVAX) 95093 UNT/0.65ML injection  Inject 19,400 Units into the skin once.  1 vial  0   No facility-administered medications prior to visit.    Allergies  Allergen Reactions  . Amiodarone Other (See Comments)    "Made me Dr. Jack Quarto and [name]."  . Amoxicillin-Pot Clavulanate Rash  . Statins Other (See Comments)    "Puts me in a funk."    ROS As per HPI  PE: Blood pressure 103/68, pulse 55, temperature 98 F (36.7 C), temperature source Temporal, resp. rate 16, height 6' (1.829 m), weight 200 lb (90.719 kg), SpO2 100.00%. Gen: Alert, well appearing.  Patient is oriented to person, place, time, and situation. No further exam today.  LABS:  None today  IMPRESSION AND PLAN:  Type II or unspecified type diabetes mellitus without mention of complication, uncontrolled Improving control. Continue to titrate levemir to goal fasting of 100-110. Change glipizide to glipizide XL 5mg  and take 3 tabs po qAM. HbA1c today.  Hyperlipidemia Continue livalo 2mg  qd per Dr. Rayann Heman and continue gemfibrozil 600 mg bid--we'll recheck hepatic panel at next f/u visit in 4 mo.  Chronic renal insufficiency, stage III (moderate) Urine microalb/cr has been neg. Recheck BMET today.   Preventative health care Flu vaccine and prevnar 13 IM today.  An After Visit Summary was printed and given to the patient.  FOLLOW UP: Return in about 4 months (around 03/22/2014) for routine chronic illness f/u (fasting).

## 2013-11-20 NOTE — Assessment & Plan Note (Signed)
Flu vaccine and prevnar 13 IM today.

## 2013-11-20 NOTE — Assessment & Plan Note (Addendum)
Improving control. Continue to titrate levemir to goal fasting of 100-110. Change glipizide to glipizide XL 5mg  and take 3 tabs po qAM. HbA1c today.

## 2013-11-20 NOTE — Progress Notes (Signed)
Pre visit review using our clinic review tool, if applicable. No additional management support is needed unless otherwise documented below in the visit note. 

## 2013-11-25 ENCOUNTER — Encounter: Payer: Self-pay | Admitting: Family Medicine

## 2013-11-26 ENCOUNTER — Other Ambulatory Visit: Payer: Self-pay | Admitting: Family Medicine

## 2013-11-26 MED ORDER — INSULIN PEN NEEDLE 32G X 6 MM MISC: Status: DC

## 2013-11-26 MED ORDER — INSULIN ASPART 100 UNIT/ML FLEXPEN: PEN_INJECTOR | SUBCUTANEOUS | Status: DC

## 2013-11-29 IMAGING — CR DG MYELOGRAM LUMBAR
6 of 21 series · 6 of 21 positions shown · non-contrast
Comparison: none

CLINICAL DATA: Lumbar spinal stenosis.  Previous L4 laminectomy.
Left lower extremity pain.
TECHNIQUE: Contiguous axial images were obtained through the lumbar
spine without infusion. Coronal, sagittal, and disc space
reconstructions were obtained of the axial image sets.

[[hospital]]
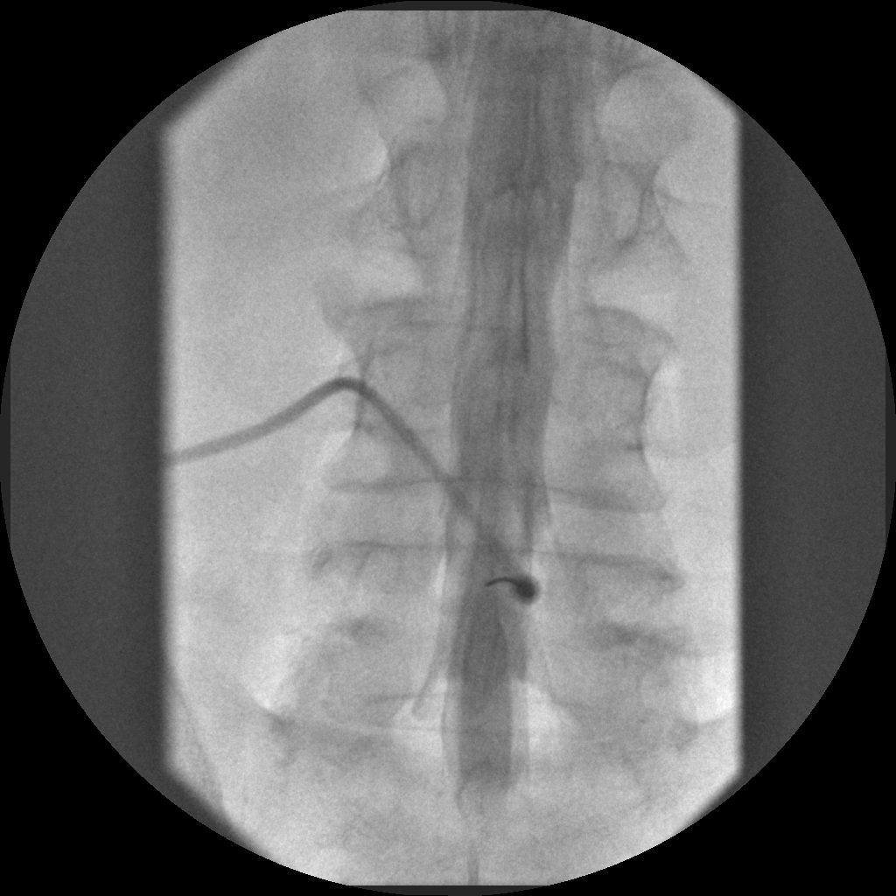

[myelogram  white (1 of 5)]
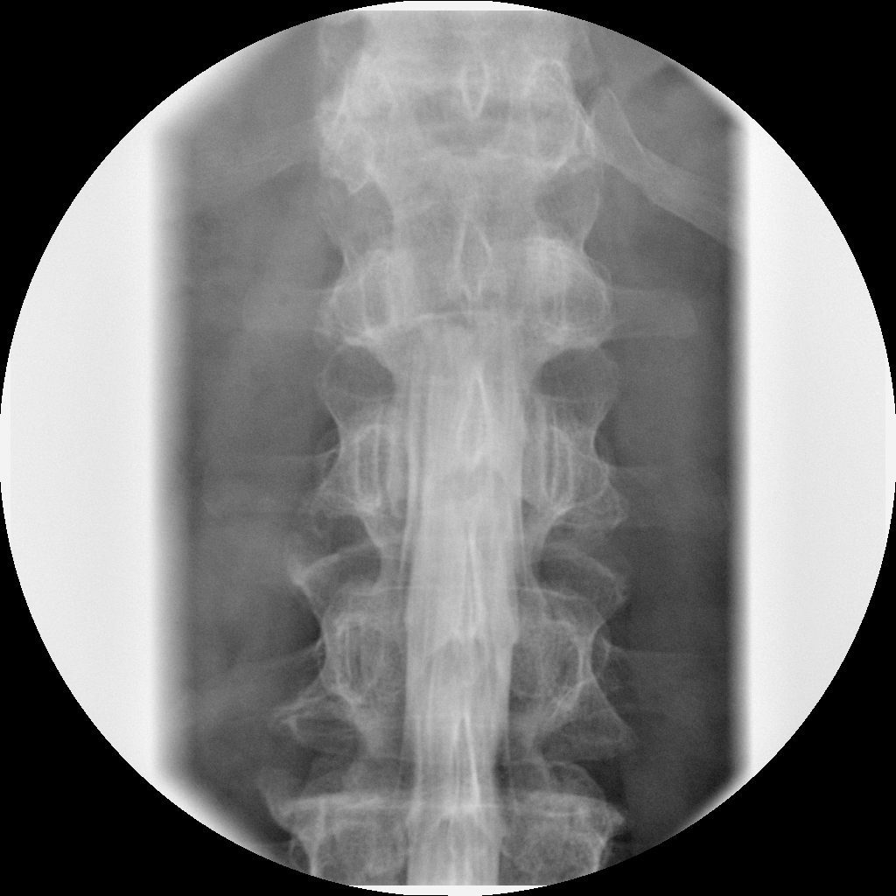

[myelogram  white (2 of 5)]
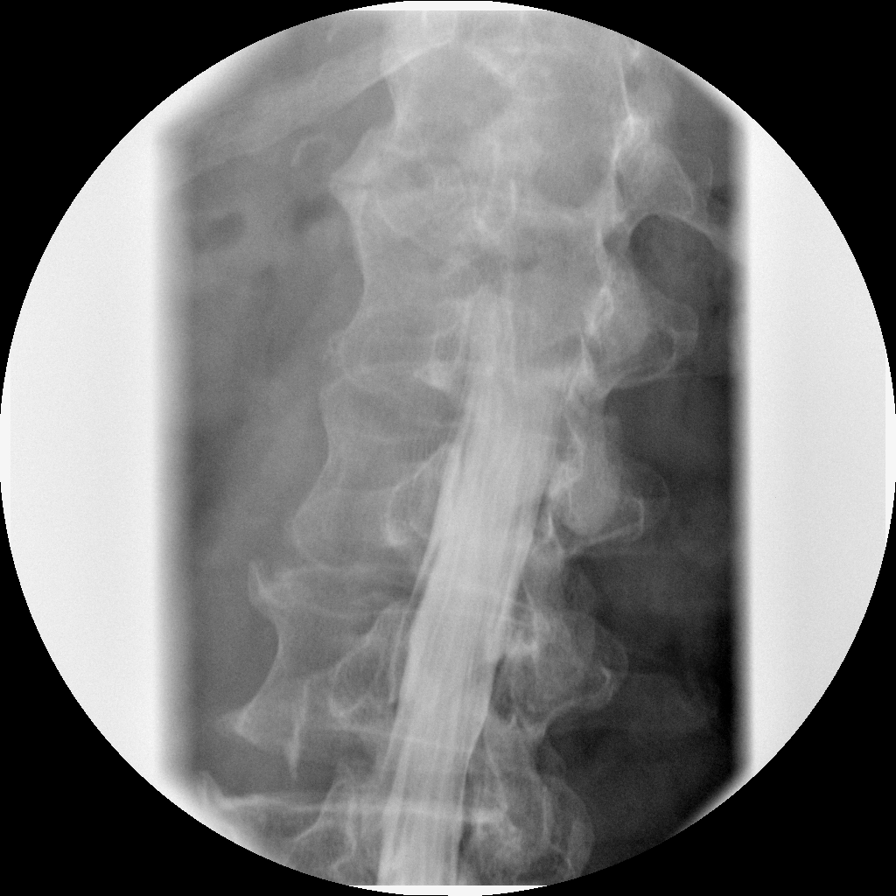

[myelogram  white (3 of 5)]
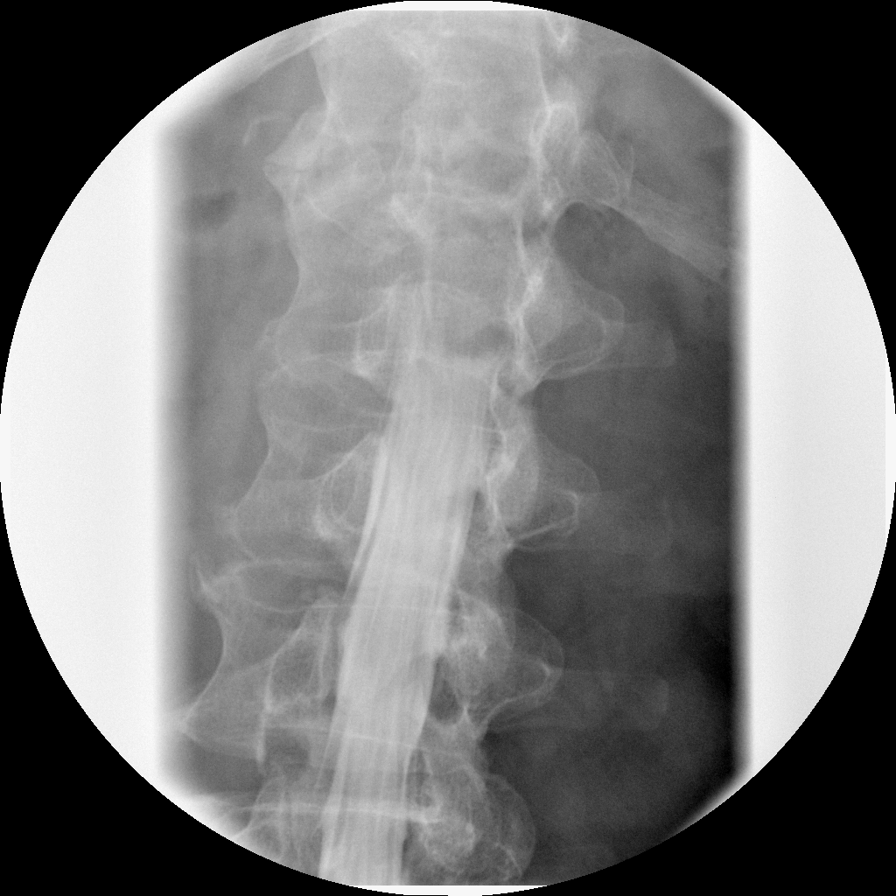

[myelogram  white (4 of 5)]
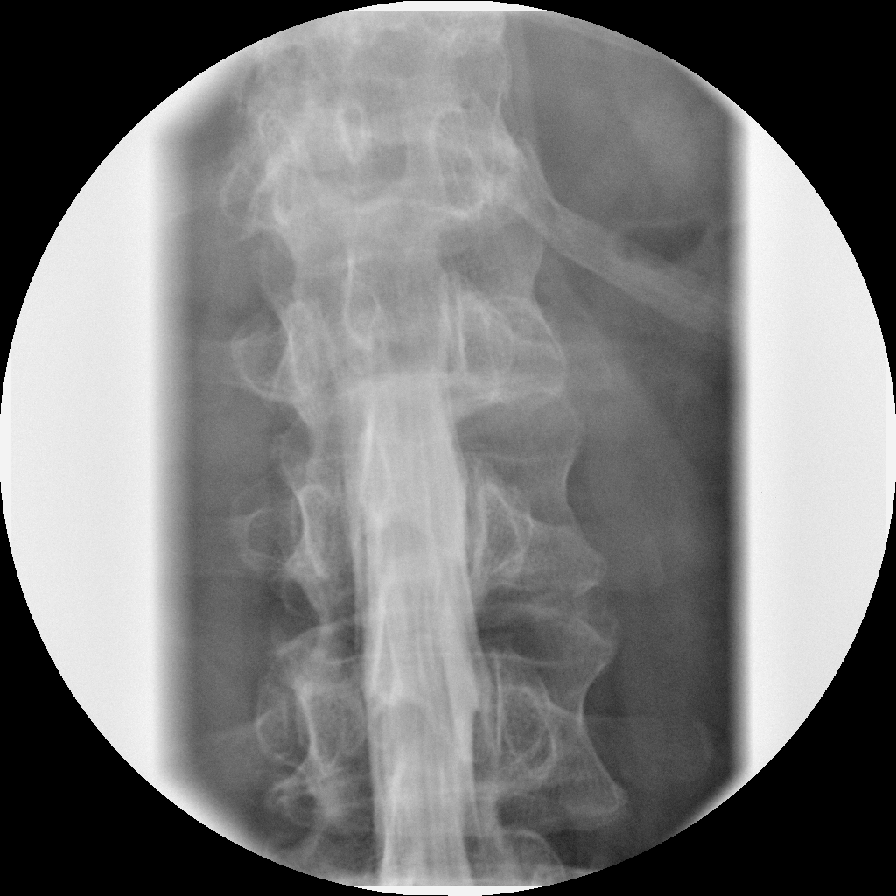

[myelogram  white (5 of 5)]
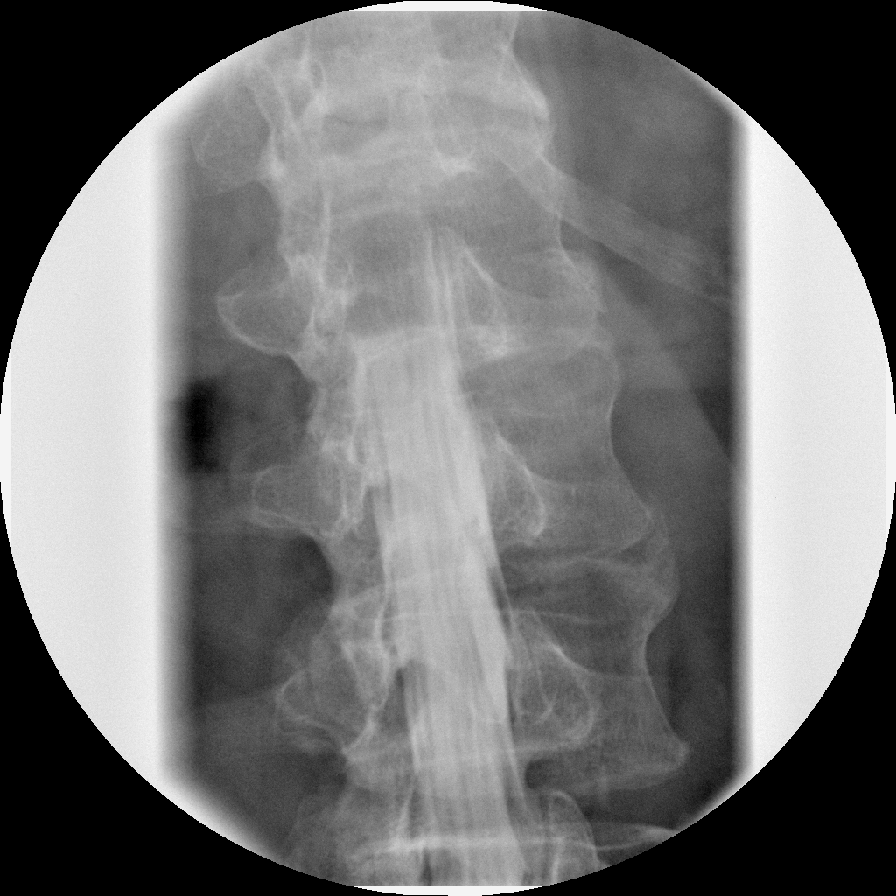

[6 of 21 positions shown; findings below may reference images not displayed]

LUMBAR MYELOGRAM

Procedure: After thorough discussion of risks and benefits of the
procedure including bleeding, infection, injury to nerves, blood
vessels, adjacent structures as well as headache and CSF leak,
written and oral informed consent was obtained.   Consent was
obtained by Dr.Namjae.

Patient was positioned prone on the fluoroscopy table. Local
anesthesia was provided with 1% lidocaine without epinephrine after
prepped and draped in the usual sterile fashion. Puncture was
performed at L4 using a 3-1/2 inches 22-gauge spinal needle via
midline approach.  Using a single pass through the dura, the needle
was placed within the thecal sac, with return of clear CSF. 15 mL
of Wmnipaque-XO8 was injected into the thecal sac, with normal
opacification of the nerve roots and cauda equina consistent with
free flow within the subarachnoid space.

Fluoroscopy time: 1 minute 9 seconds
FINDINGS: There is a mild dextroconvex curvature with the apex at
L2 in the standing neutral position.  Vertebral body height is
preserved.  Laminectomy at L4 is evident, with decompression of the
thecal sac.  With leftward and rightward bending there is no
definite dynamic stenosis identified.

L4-L5 shallow disc bulge is present with mild impression on the
thecal sac.  Less pronounced shallow disc bulges present at L3-L4.
Other levels appear within normal limits.  Nerve root sleeves
appear within normal limits.  Thoracic spine DISH with near
bridging osteophytes at T12-L1.
IMPRESSION: 1.  Technically successful lumbar puncture for lumbar myelogram.
2.  L4 laminectomy with broad-based disc bulge.  Central canal
appears decompressed posteriorly.
3.  L3-L4 shallow disc bulge.
4.  Mild dextroconvex curvature of the lumbar spine in the neutral
position on standing radiographs with the apex at L2.

CT LUMBAR MYELOGRAM
FINDINGS: Alignment at the time of supine CT scanning is normal.
No spondylolisthesis.  Spinal cord terminates posterior to T12-L1.
Paraspinal soft tissues demonstrate mild abdominal aortic and
visceral atherosclerosis.  No aneurysm is visualized.  Mild
bilateral SI joint degenerative disease.  No arachnoiditis is
present in this postoperative patient.  Postoperative changes of
bilateral L4 laminotomies for posterior decompression noted.

T12-L1:  Near bridging osteophytes anteriorly.  No stenosis.

L1-L2:  Negative.

L2-L3:  Mild facet hypertrophy with left greater than right facet
overgrowth.  The central canal and lateral recesses are patent.
Shallow broad-based posterior disc bulging is present without
resulting stenosis.

L3-L4:  Mild central stenosis associated with broad-based disc
bulging.  Bulging disc extends into both neural foramina with mild
narrowing but no neural compression. Mild to moderate bilateral
facet arthrosis.  Crowding of both lateral recesses without
effacement of contrast in the nerve root sleeves.

L4-L5:  Bilateral laminotomies with wide posterior decompression.
Mild facet hypertrophy.  Crowding both lateral recesses without
effacement of contrast in the nerve root sleeves.  Circumferential
disc bulge extends into both neural foramina.  Minimal symmetric
foraminal stenosis.

L5-S1:  Broad-based protrusion is present eccentric to the right.
This has mild mass effect on the right descending S1 nerve without
complete effacement of contrast in the nerve root sleeve.  Mild
bilateral facet arthrosis.  Foramina patent.
IMPRESSION: 1.  L4-L5 bilateral laminotomies with wide posterior decompression.
Mild facet hypertrophy with mild bilateral lateral recess
encroachment without complete effacement of contrast within the
descending L5 nerve root sleeves.  Mild symmetrical foraminal
stenosis associated with short pedicles and disc bulging.
2.  L5-S1 right eccentric broad-based central disc protrusion
contacting the descending right S1 nerve with mild effacement of
contrast in the nerve roots sleeve.
3.  L3-L4 circumferential disc bulge with mild to moderate
bilateral facet arthrosis and posterior ligamentum flavum
redundancy.  Mild central stenosis.  Crowding both lateral recesses
due to facet hypertrophy and disc bulging without neural
compression.  Mild symmetric bilateral foraminal stenosis
associated bulging disc and short pedicles.

## 2013-11-29 IMAGING — CT CT L SPINE W/ CM
2 series · 10 of 14 positions shown, 12 images · non-contrast
Comparison: none

CLINICAL DATA: Lumbar spinal stenosis.  Previous L4 laminectomy.
Left lower extremity pain.
TECHNIQUE: Contiguous axial images were obtained through the lumbar
spine without infusion. Coronal, sagittal, and disc space
reconstructions were obtained of the axial image sets.

[Series 2: l spine bone · axial · 0.27mm/px · z∈[-292,-122]mm · 5 of 102 slices shown, 7 images]
[im 17/102  soft-tissue]
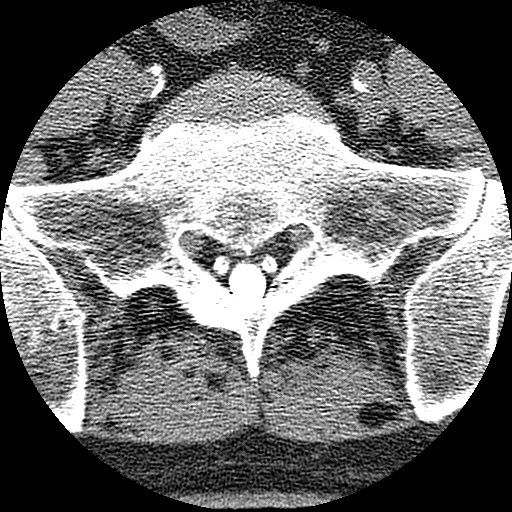
[im 17/102  bone]
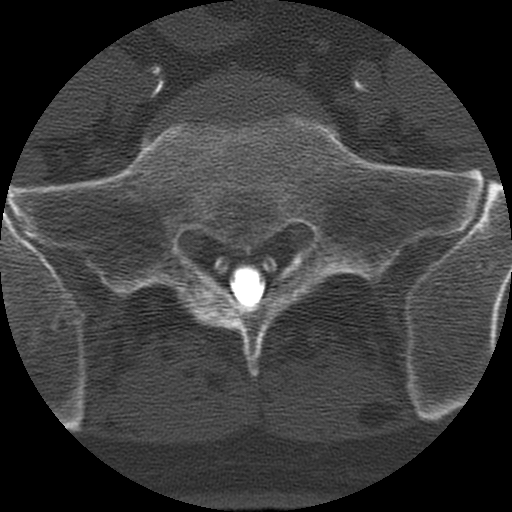
[im 34/102  bone]
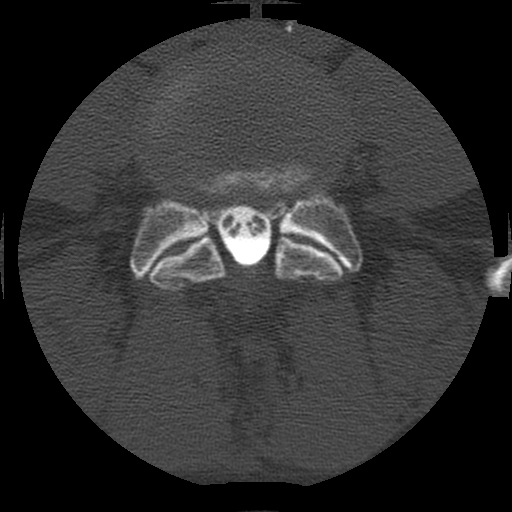
[im 51/102  bone]
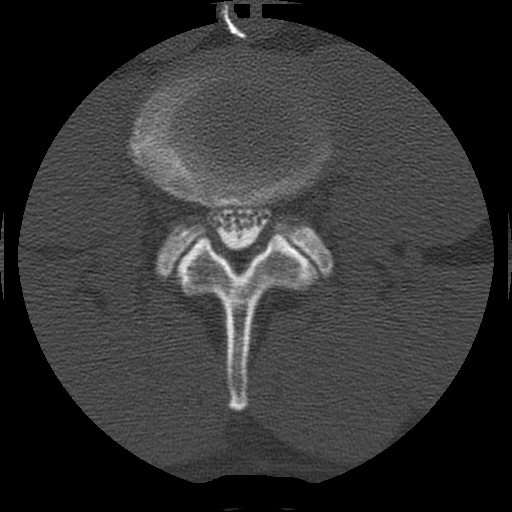
[im 68/102  bone]
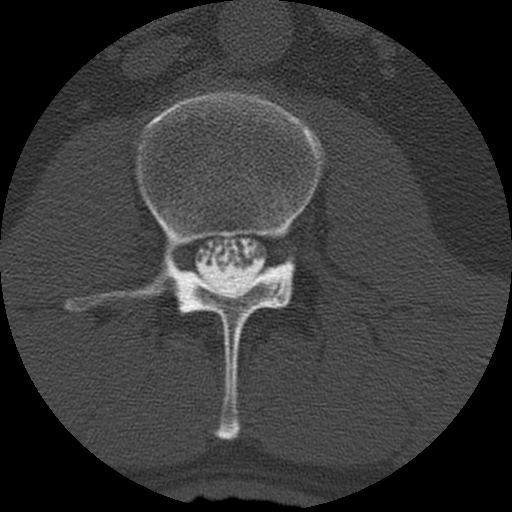
[im 85/102  soft-tissue]
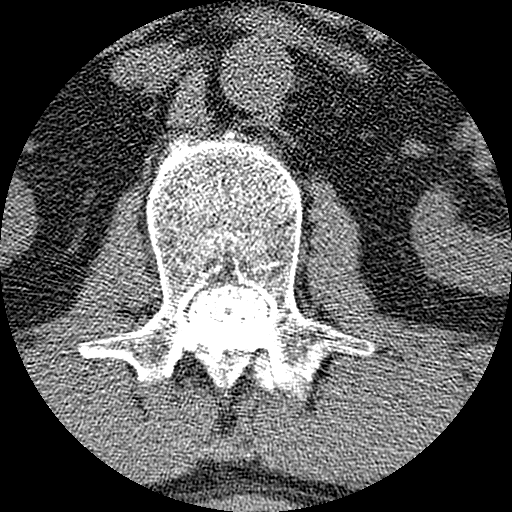
[im 85/102  bone]
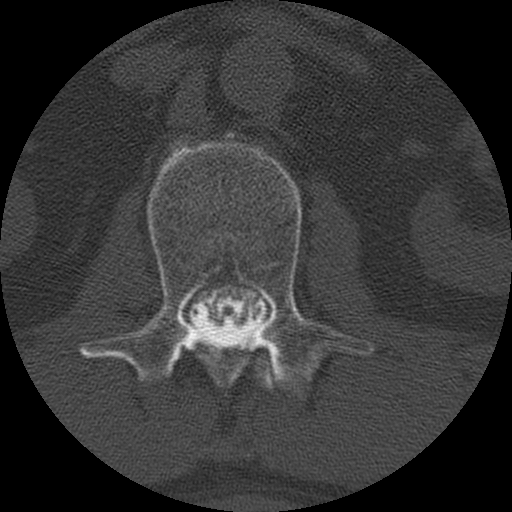

[Series 3: l spine soft · axial · 0.27mm/px · z∈[-292,-122]mm · 5 of 102 slices shown]
[im 17/102  soft-tissue]
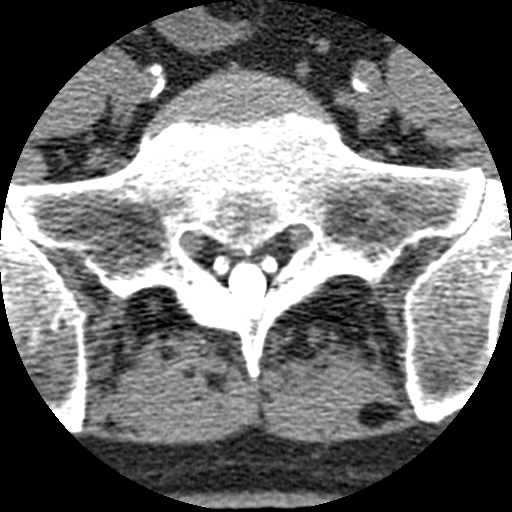
[im 34/102  soft-tissue]
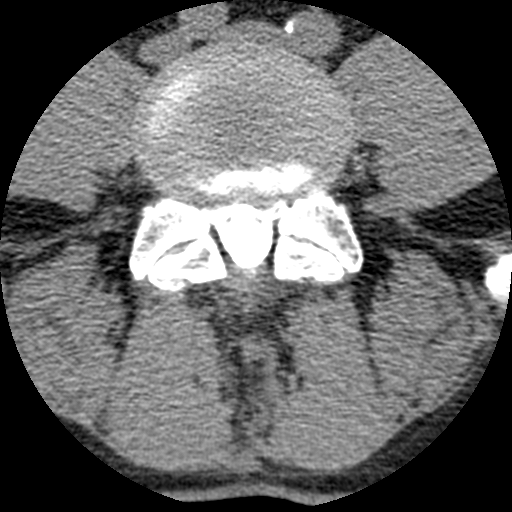
[im 51/102  soft-tissue]
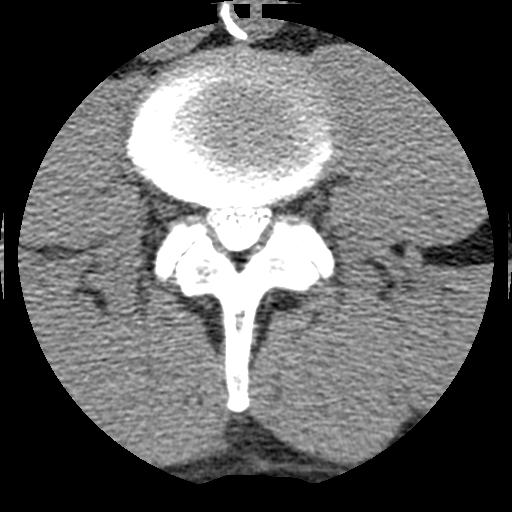
[im 68/102  soft-tissue]
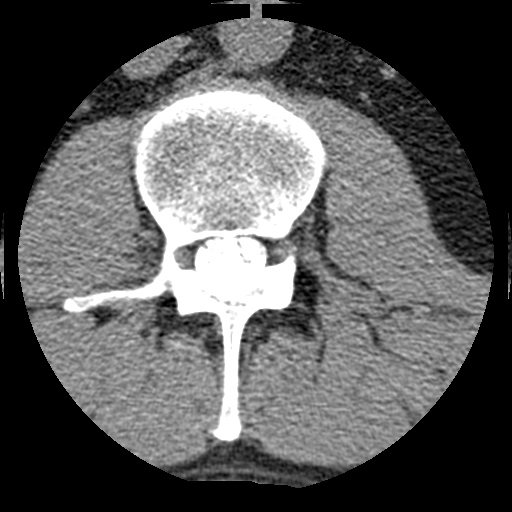
[im 85/102  soft-tissue]
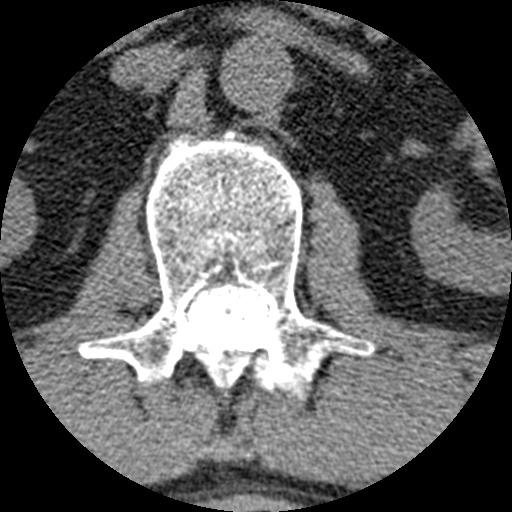

[10 of 14 positions shown; findings below may reference images not displayed]

LUMBAR MYELOGRAM

Procedure: After thorough discussion of risks and benefits of the
procedure including bleeding, infection, injury to nerves, blood
vessels, adjacent structures as well as headache and CSF leak,
written and oral informed consent was obtained.   Consent was
obtained by Dr.Namjae.

Patient was positioned prone on the fluoroscopy table. Local
anesthesia was provided with 1% lidocaine without epinephrine after
prepped and draped in the usual sterile fashion. Puncture was
performed at L4 using a 3-1/2 inches 22-gauge spinal needle via
midline approach.  Using a single pass through the dura, the needle
was placed within the thecal sac, with return of clear CSF. 15 mL
of Wmnipaque-XO8 was injected into the thecal sac, with normal
opacification of the nerve roots and cauda equina consistent with
free flow within the subarachnoid space.

Fluoroscopy time: 1 minute 9 seconds
FINDINGS: There is a mild dextroconvex curvature with the apex at
L2 in the standing neutral position.  Vertebral body height is
preserved.  Laminectomy at L4 is evident, with decompression of the
thecal sac.  With leftward and rightward bending there is no
definite dynamic stenosis identified.

L4-L5 shallow disc bulge is present with mild impression on the
thecal sac.  Less pronounced shallow disc bulges present at L3-L4.
Other levels appear within normal limits.  Nerve root sleeves
appear within normal limits.  Thoracic spine DISH with near
bridging osteophytes at T12-L1.
IMPRESSION: 1.  Technically successful lumbar puncture for lumbar myelogram.
2.  L4 laminectomy with broad-based disc bulge.  Central canal
appears decompressed posteriorly.
3.  L3-L4 shallow disc bulge.
4.  Mild dextroconvex curvature of the lumbar spine in the neutral
position on standing radiographs with the apex at L2.

CT LUMBAR MYELOGRAM
FINDINGS: Alignment at the time of supine CT scanning is normal.
No spondylolisthesis.  Spinal cord terminates posterior to T12-L1.
Paraspinal soft tissues demonstrate mild abdominal aortic and
visceral atherosclerosis.  No aneurysm is visualized.  Mild
bilateral SI joint degenerative disease.  No arachnoiditis is
present in this postoperative patient.  Postoperative changes of
bilateral L4 laminotomies for posterior decompression noted.

T12-L1:  Near bridging osteophytes anteriorly.  No stenosis.

L1-L2:  Negative.

L2-L3:  Mild facet hypertrophy with left greater than right facet
overgrowth.  The central canal and lateral recesses are patent.
Shallow broad-based posterior disc bulging is present without
resulting stenosis.

L3-L4:  Mild central stenosis associated with broad-based disc
bulging.  Bulging disc extends into both neural foramina with mild
narrowing but no neural compression. Mild to moderate bilateral
facet arthrosis.  Crowding of both lateral recesses without
effacement of contrast in the nerve root sleeves.

L4-L5:  Bilateral laminotomies with wide posterior decompression.
Mild facet hypertrophy.  Crowding both lateral recesses without
effacement of contrast in the nerve root sleeves.  Circumferential
disc bulge extends into both neural foramina.  Minimal symmetric
foraminal stenosis.

L5-S1:  Broad-based protrusion is present eccentric to the right.
This has mild mass effect on the right descending S1 nerve without
complete effacement of contrast in the nerve root sleeve.  Mild
bilateral facet arthrosis.  Foramina patent.
IMPRESSION: 1.  L4-L5 bilateral laminotomies with wide posterior decompression.
Mild facet hypertrophy with mild bilateral lateral recess
encroachment without complete effacement of contrast within the
descending L5 nerve root sleeves.  Mild symmetrical foraminal
stenosis associated with short pedicles and disc bulging.
2.  L5-S1 right eccentric broad-based central disc protrusion
contacting the descending right S1 nerve with mild effacement of
contrast in the nerve roots sleeve.
3.  L3-L4 circumferential disc bulge with mild to moderate
bilateral facet arthrosis and posterior ligamentum flavum
redundancy.  Mild central stenosis.  Crowding both lateral recesses
due to facet hypertrophy and disc bulging without neural
compression.  Mild symmetric bilateral foraminal stenosis
associated bulging disc and short pedicles.

## 2013-12-09 LAB — HM DIABETES EYE EXAM

## 2013-12-10 ENCOUNTER — Other Ambulatory Visit: Payer: Self-pay | Admitting: Nurse Practitioner

## 2013-12-10 ENCOUNTER — Other Ambulatory Visit (HOSPITAL_COMMUNITY): Payer: Self-pay | Admitting: Internal Medicine

## 2013-12-10 ENCOUNTER — Telehealth: Payer: Self-pay | Admitting: Family Medicine

## 2013-12-10 DIAGNOSIS — E119 Type 2 diabetes mellitus without complications: Secondary | ICD-10-CM

## 2013-12-10 MED ORDER — GLIPIZIDE 5 MG PO TABS
5.0000 mg | ORAL_TABLET | Freq: Two times a day (BID) | ORAL | Status: DC
Start: 2013-12-10 — End: 2013-12-15

## 2013-12-10 NOTE — Telephone Encounter (Signed)
Pt called very mad about running out of his glipizide.  I looked through the lab notes and it states he was to D/C glipizide but pt states his insurance won't cover his meal time insulin.  Patient was very upset that I was unable to help him so I had him speak with our office manager, Salomon Fick.

## 2013-12-10 NOTE — Progress Notes (Signed)
Lisa, pls do eRx for humalog pen, 5 units SQ qAC, #41ml, RF x 6.   Remind pt when he gets this and starts taking it that he should go ahead and stop his glipizide. -thx

## 2013-12-11 MED ORDER — INSULIN LISPRO 100 UNIT/ML (KWIKPEN): PEN_INJECTOR | SUBCUTANEOUS | Status: DC

## 2013-12-11 NOTE — Telephone Encounter (Signed)
Patient has been put on humalog pen per Dr. Anitra Lauth.  Patient was left a VM to notify him.

## 2013-12-11 NOTE — Progress Notes (Signed)
Rx sent into pharmacy.  Left message on pt's cell.

## 2013-12-15 ENCOUNTER — Other Ambulatory Visit: Payer: Self-pay | Admitting: Family Medicine

## 2013-12-15 MED ORDER — INSULIN LISPRO 100 UNIT/ML (KWIKPEN): PEN_INJECTOR | SUBCUTANEOUS | Status: DC

## 2013-12-17 ENCOUNTER — Telehealth: Payer: Self-pay

## 2013-12-17 MED ORDER — INSULIN DETEMIR 100 UNIT/ML FLEXPEN: PEN_INJECTOR | SUBCUTANEOUS | Status: DC

## 2013-12-17 NOTE — Telephone Encounter (Signed)
Pt called needing a refill on his Levemir. Sent in [facility] to [facility].

## 2013-12-18 ENCOUNTER — Telehealth: Payer: Self-pay | Admitting: Family Medicine

## 2013-12-18 DIAGNOSIS — E119 Type 2 diabetes mellitus without complications: Secondary | ICD-10-CM

## 2013-12-18 MED ORDER — GLIPIZIDE 5 MG PO TABS: 5.0000 mg | ORAL_TABLET | Freq: Two times a day (BID) | ORAL | Status: DC

## 2013-12-18 NOTE — Telephone Encounter (Signed)
[facility] back and spoke with pharmacist.

## 2013-12-18 NOTE — Telephone Encounter (Signed)
[facility] called and has a question in regards to [facility] medication request. Please call and verify RX./ [facility]

## 2013-12-22 ENCOUNTER — Encounter: Payer: Self-pay | Admitting: Family Medicine

## 2013-12-22 ENCOUNTER — Ambulatory Visit (INDEPENDENT_AMBULATORY_CARE_PROVIDER_SITE_OTHER): Payer: 59 | Admitting: Family Medicine

## 2013-12-22 VITALS — BP 114/68 | HR 60 | Temp 98.4°F | Resp 18 | Ht 72.0 in | Wt 200.0 lb

## 2013-12-22 DIAGNOSIS — N63 Unspecified lump in unspecified breast: Secondary | ICD-10-CM

## 2013-12-22 DIAGNOSIS — J018 Other acute sinusitis: Secondary | ICD-10-CM

## 2013-12-22 DIAGNOSIS — E1065 Type 1 diabetes mellitus with hyperglycemia: Secondary | ICD-10-CM

## 2013-12-22 DIAGNOSIS — IMO0002 Reserved for concepts with insufficient information to code with codable children: Secondary | ICD-10-CM | POA: Insufficient documentation

## 2013-12-22 MED ORDER — INSULIN DETEMIR 100 UNIT/ML FLEXPEN: PEN_INJECTOR | SUBCUTANEOUS | Status: DC

## 2013-12-22 MED ORDER — AZITHROMYCIN 250 MG PO TABS: ORAL_TABLET | ORAL | Status: DC

## 2013-12-22 NOTE — Progress Notes (Signed)
OFFICE NOTE  12/22/2013  CC:  Chief Complaint  Patient presents with  . URI    x 2 weeks  . Diabetes  . Breast Mass    left sided    HPI: Patient is a 63 y.o. Caucasian male who is here for URI sx's, also has some questions about some recent insulin changes we made. He is up to 50 U per night levemir and 6 units humalog at each meal.  We reviewed once again the basics of insulin titration and the effects of activity and diet on glucoses.  Has had nasal congestion/PND > 10d, a little cough from night-time PND, no signif ST.   Sx's are about the same as when the illness started.  No fever.  Trying tylenol sinus OTC, some kind of nasal spray.  Has hx CABG, at which time they took his mammary gland out per pt report, now feels a little lump lateral to left nipple for the last couple of weeks.  No nipple d/c.  No pain.  Pertinent PMH:  Past medical, surgical, social, and family history reviewed and no changes are noted since last office visit.  MEDS: Not taking glipizide or novolog listed below (he is taking humalog 6 U qAC Outpatient Prescriptions Prior to Visit  Medication Sig Dispense Refill  . clopidogrel (PLAVIX) 75 MG tablet TAKE 1 TABLET (75 MG TOTAL) BY MOUTH DAILY.  90 tablet  1  . fexofenadine (ALLEGRA) 30 MG tablet Take 30 mg by mouth 2 (two) times daily.      Marland Kitchen gemfibrozil (LOPID) 600 MG tablet TAKE 1 TABLET (600 MG TOTAL) BY MOUTH 2 (TWO) TIMES DAILY BEFORE A MEAL.  60 tablet  9  . glipiZIDE (GLUCOTROL) 5 MG tablet Take 1 tablet (5 mg total) by mouth 2 (two) times daily before a meal.  180 tablet  1  . hydrochlorothiazide (HYDRODIURIL) 25 MG tablet 1/2 tab po qd  90 tablet  1  . Insulin Detemir (LEVEMIR FLEXTOUCH) 100 UNIT/ML Pen 18 U SQ qhs  28 mL  1  . insulin lispro (HUMALOG) 100 UNIT/ML KiwkPen Take as directed by MD  15 mL  6  . Insulin Pen Needle (NOVOFINE) 32G X 6 MM MISC Use with insulin pen (one at bedtime and one at each meal= 4 per day)  100 each  12  .  metoprolol (LOPRESSOR) 50 MG tablet TAKE 1 AND 1/2 TABLETS BY MOUTH TWICE A DAY  270 tablet  2  . Multiple Vitamin (MULTIVITAMIN) capsule Take 1 capsule by mouth daily.      . ONE TOUCH ULTRA TEST test strip TEST TWICE A DAY  50 each  1  . pantoprazole (PROTONIX) 40 MG tablet Take 1 tablet (40 mg total) by mouth 2 (two) times daily.  180 tablet  3  . Pitavastatin Calcium (LIVALO) 2 MG TABS 1 tab po three times per week  12 tablet  6  . ramipril (ALTACE) 10 MG capsule TAKE 1 CAPSULE (10 MG TOTAL) BY MOUTH DAILY.  30 capsule  5  . traMADol (ULTRAM) 50 MG tablet Take by mouth every 6 (six) hours as needed.      . vitamin B-12 (CYANOCOBALAMIN) 100 MCG tablet Take 50 mcg by mouth daily.       No facility-administered medications prior to visit.    PE: Blood pressure 114/68, pulse 60, temperature 98.4 F (36.9 C), temperature source Temporal, resp. rate 18, height 6' (1.829 m), weight 200 lb (90.719 kg), SpO2 99.00%. VS:  noted--normal. Gen: alert, NAD, NONTOXIC APPEARING. HEENT: eyes without injection, drainage, or swelling.  Ears: EACs clear, TMs with normal light reflex and landmarks.  Nose: Clear rhinorrhea, with some dried, crusty exudate adherent to mildly injected mucosa.  No purulent d/c.  No paranasal sinus TTP.  No facial swelling.  Throat and mouth without focal lesion.  No pharyngial swelling, erythema, or exudate.   Neck: supple, no LAD.   LUNGS: CTA bilat, nonlabored resps.   CV: RRR, no m/r/g. EXT: no c/c/e SKIN: no rash CHEST: left side, upper-outer area of pectoralis muscle (actually along the lateral border of pectoralis mm) is a 1-2 cm, oval, nontender, firm/rubbery, moveable subcutaneous nodule.  No overlying skin changes.  No nipple d/c.  No axillary LAD or mass.  LAB: none today  IMPRESSION AND PLAN:  1) DM 2, insulin-requiring, poor control.  Has been on levemir and most recently stopped sulfonylurea in daytime and added mealtime humalog tid. We had further  discussion/education today regarding basics of diet, activity level, insulin titration, glucose monitoring.  Reassured patient. No med changes or new testing today.  2) Prolonged URI/acute sinusitis. Azithromycin x 5d.  Saline nasal irrigation. Stop OTC cold pills.  3) Left breast nodule: question of lymph node. Discussed radiologic w/u with radiologist at [facility] center today and he recommended bilat diagnostic mammogram as first step and then said to order left breast u/s in case this was needed as a f/u imaging test. Ordered these today.  Spent 25 min with pt today, with >50% of this time spent in counseling and care coordination regarding the above problems.  An After Visit Summary was printed and given to the patient.  FOLLOW UP: prn (keep routine chronic illness f/u appt set for 03/19/14)

## 2013-12-22 NOTE — Progress Notes (Signed)
Pre visit review using our clinic review tool, if applicable. No additional management support is needed unless otherwise documented below in the visit note. 

## 2013-12-24 ENCOUNTER — Other Ambulatory Visit (HOSPITAL_COMMUNITY): Payer: Self-pay | Admitting: Internal Medicine

## 2013-12-27 HISTORY — PX: OTHER SURGICAL HISTORY: SHX169

## 2013-12-28 ENCOUNTER — Telehealth: Payer: Self-pay | Admitting: Neurology

## 2013-12-28 NOTE — Telephone Encounter (Signed)
Pt due one yr follow up in November, Left message for pt to call and schedule. Awaiting return call / Gayleen Orem

## 2013-12-29 ENCOUNTER — Encounter: Payer: Self-pay | Admitting: Family Medicine

## 2013-12-31 NOTE — Telephone Encounter (Signed)
Pt has called back and scheduled appt as requested / Tommy S.

## 2014-01-01 ENCOUNTER — Ambulatory Visit
Admission: RE | Admit: 2014-01-01 | Discharge: 2014-01-01 | Disposition: A | Discharge status: Home / Self Care | Payer: 59 | Source: Ambulatory Visit | Attending: Family Medicine | Admitting: Family Medicine

## 2014-01-01 DIAGNOSIS — N63 Unspecified lump in unspecified breast: Secondary | ICD-10-CM

## 2014-01-14 ENCOUNTER — Ambulatory Visit (INDEPENDENT_AMBULATORY_CARE_PROVIDER_SITE_OTHER): Payer: 59 | Admitting: Neurology

## 2014-01-14 ENCOUNTER — Encounter: Payer: Self-pay | Admitting: Neurology

## 2014-01-14 VITALS — BP 120/60 | HR 74 | Ht 73.0 in | Wt 202.0 lb

## 2014-01-14 DIAGNOSIS — G463 Brain stem stroke syndrome: Secondary | ICD-10-CM

## 2014-01-14 DIAGNOSIS — M5432 Sciatica, left side: Secondary | ICD-10-CM

## 2014-01-14 DIAGNOSIS — G464 Cerebellar stroke syndrome: Secondary | ICD-10-CM

## 2014-01-14 NOTE — Progress Notes (Signed)
Tommy Morse was seen today in the neurology clinic.  While the patient is new to me, he has previously been seen here by Dr. Jacelyn Morse and Dr. Tomma Morse.  I reviewed the records made available to me.  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  At that time, he had an MRA of the brain, and subsequently had a CTA that was unremarkable.  A carotid ultrasound was unremarkable per Dr. Jacelyn Morse (unable to find that study today).  An echocardiogram also was normal.  He was placed on Plavix and Zetia.  He remains on Plavix and is doing well on the medication.  He denies melena or hematochezia.  He is scheduled to have a colonoscopy next month, and needs to be off of the medication in order to have the colonoscopy.  The pt does continue to have facial and head pains (electric like, don't last long but present daily) on the R side of the head, but aren't bothersome enough to require medication.  The patient has also been seen here previously for chronic left buttock and leg pain.  He is status post laminectomy.  The pain is down the posterior aspect of the L leg.  He had an extensive workup of [facility].  He had epidural steroids as well as oral medications, including Neurontin and Lyrica that did not provide significant benefit.  He subsequently went to pain management.   He has been all over, including [facility].  He was recommended to have a Tommy Morse stimulator but he is leary since there has been no identifiable nerve issue.  He was seen by Dr. Thom Morse in [place], had an EMG without any nerve damange noted.  He is still having tx with chiropractics and an accupuncturist.  01/14/14 update:  Pt returns today for yearly f/u.  He is doing about the same.  Continues to have the leg pain.  He is seeing a new PT and is hoping to get the pain under better control.  Is still on plavix and doing well.  Last U/S of the carotids a year ago was normal.  No melena or hematochezia on plavix.  His  cholesterol was just checked and was 344.  Cannot tolerate statin therapy.  Tried fish oil in past but is no longer.  He c/o "pings of pain" in the head when he lays down at night.  It is a stabbing pain throughout the head.  It is not worth taking medication for.  Pt admits that he is not exercising much, but he is trying to work out in the pool but otherwise his leg hurts to work out.     PREVIOUS MEDICATIONS: n/a  ALLERGIES:   Allergies  Allergen Reactions  . Amiodarone Other (See Comments)    "Made me Tommy Morse and Tommy Morse."  . Amoxicillin-Pot Clavulanate Rash  . Statins Other (See Comments)    "Puts me in a funk."    CURRENT MEDICATIONS:  Current Outpatient Prescriptions on File Prior to Visit  Medication Sig Dispense Refill  . clopidogrel (PLAVIX) 75 MG tablet TAKE 1 TABLET (75 MG TOTAL) BY MOUTH DAILY. 90 tablet 1  . gemfibrozil (LOPID) 600 MG tablet TAKE 1 TABLET (600 MG TOTAL) BY MOUTH 2 (TWO) TIMES DAILY BEFORE A MEAL. 60 tablet 9  . hydrochlorothiazide (HYDRODIURIL) 25 MG tablet TAKE 1 TABLET BY MOUTH DAILY 90 tablet 1  . Insulin Detemir (LEVEMIR FLEXTOUCH) 100 UNIT/ML Pen 60 U SQ qhs 30 mL 1  .  insulin lispro (HUMALOG) 100 UNIT/ML KiwkPen Take as directed by MD 15 mL 6  . Insulin Pen Needle (NOVOFINE) 32G X 6 MM MISC Use with insulin pen (one at bedtime and one at each meal= 4 per day) 100 each 12  . metoprolol (LOPRESSOR) 50 MG tablet TAKE 1 AND 1/2 TABLETS BY MOUTH TWICE A DAY 270 tablet 2  . Multiple Vitamin (MULTIVITAMIN) capsule Take 1 capsule by mouth daily.    . ONE TOUCH ULTRA TEST test strip TEST TWICE A DAY 50 each 1  . pantoprazole (PROTONIX) 40 MG tablet Take 1 tablet (40 mg total) by mouth 2 (two) times daily. 180 tablet 3  . ramipril (ALTACE) 10 MG capsule TAKE 1 CAPSULE (10 MG TOTAL) BY MOUTH DAILY. 30 capsule 5  . vitamin B-12 (CYANOCOBALAMIN) 100 MCG tablet Take 50 mcg by mouth daily.    . [DISCONTINUED] gabapentin (NEURONTIN) 300 MG capsule 600 mg.  increase to 1 tab three times a day as directed     No current facility-administered medications on file prior to visit.    PAST MEDICAL HISTORY:   Past Medical History  Diagnosis Date  . Hypertension   . Hyperlipemia, mixed     intolerance to statins  . LVH (left ventricular hypertrophy)   . Hypertriglyceridemia     Triglycerides 741 March '13 - Initiated on Gemfibrozil  . Diabetes mellitus     A1C 7.2 Aug '13, microalbumin slightly elevated  . Coronary artery disease     CABG - x6 - 2008, - Lexiscan myoview 05/14/11 no inducible ischemia, EF 68%  . 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 Left sided sciatica 2012    neuropathic meds no help/side effects  . Left hip pain 2012    Tommy Morse--injections no help---long record of specialists ([facility] did CT lumbar myelogram and EMG/NCS.  Neurology did MRI L spine), still no final explanation and spinal stimulator may be ultimate treatment  . BPH (benign prostatic hypertrophy)   . History of balanitis     pt uncircumcised  . GERD (gastroesophageal reflux disease)   . History of adenomatous polyp of colon 03/2013    Recall 5 yrs  . Hypogonadism male     Testost implants --testopel-- 05/2013 (Dr. Ivory Broad [phone])  . Chronic renal insufficiency, stage III (moderate) 11/20/2013    CrCl 50s    PAST SURGICAL HISTORY:   Past Surgical History  Procedure Laterality Date  . Cardiac catheterization  04/19/06    EF 60-65%  . 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 Laminectomy  06/2009    L3-L4  . Back surgery  2013  . Prostate surgery      ?TURP--need old records  . Esophagogastroduodenoscopy (egd) with esophageal dilation  01/2013    EGD normal (no stricture) but dilation done for pt's dysphagia  . Colonoscopy w/ polypectomy  03/2013    Tubular adenoma; repeat 5 yrs    SOCIAL HISTORY:   History   Social History  . Marital  Status: Married    Spouse Name: N/A    Number of Children: N/A  . Years of Education: N/A   Occupational History  . pastor     [place]   Social History Main Topics  . Smoking status: Never Smoker   . Smokeless tobacco: Never Used  . Alcohol Use: No  . Drug Use: No  .  Sexual Activity: Not on file   Other Topics Concern  . Not on file   Social History Narrative   Married, 2 sons and 1 daughter.  Two grandsons.   Orig from [place].  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   No T/A/Ds.    FAMILY HISTORY:   Family Status  Relation Status Death Age  . Mother Deceased 23    of MI / and valvular heart disease  . Father Deceased 58    of MI / father also had bypass  . Brother Deceased     from alcohol and drugs  . Brother Deceased     alcohol and drugs    ROS:  A complete 10 system review of systems was obtained and was unremarkable apart from what is mentioned above.  PHYSICAL EXAMINATION:    VITALS:   Filed Vitals:   01/14/14 1344  BP: 120/60  Pulse: 74  Height: 6\' 1"  (1.854 m)  Weight: 202 lb (91.627 kg)    GEN:  Normal appears male in no acute distress.  Appears stated age. HEENT:  Normocephalic, atraumatic. The mucous membranes are moist. The superficial temporal arteries are without ropiness or tenderness. Cardiovascular: Regular rate and rhythm. Lungs: Clear to auscultation bilaterally. Neck/Heme: There are no carotid bruits noted bilaterally.  NEUROLOGICAL: Orientation:  The patient is alert and oriented x 3.  Fund of knowledge is appropriate.  Recent and remote memory intact.  Attention span and concentration normal.  Repeats and names without difficulty. Cranial nerves: There is good facial symmetry. The pupils are equal round and reactive to light bilaterally. Fundoscopic exam reveals clear disc margins bilaterally. Extraocular muscles are intact and visual fields are full to confrontational testing. Speech is fluent and clear. Soft  palate rises symmetrically and there is no tongue deviation. Hearing is intact to conversational tone. Tone: Tone is good throughout. Sensation: There is decreased pin across the R face c/t the L.  There is decreased pin over the L superficial peroneal sensory distribution compared to the L. Vibration is intact at the bilateral big toe. There is no extinction with double simultaneous stimulation.  Coordination:  The patient has no difficulty with RAM's or FNF bilaterally. Motor: Strength is 5/5 in the bilateral upper and lower extremities.  Shoulder shrug is equal and symmetric. There is no pronator drift.  There are no fasciculations noted. Gait and Station: The patient ambulates with antalgic gait today   IMPRESSION/PLAN  1. S/P Lateral Medullary Infarct (Wallenberg) in 10/2010.    -His current stroke risk factors are hypertension hyperlipidemia, diabetes mellitus, and the greatest risk factor is that he has had a prior stroke now.  We talked about modifying risk factors.  His cholesterol is significantly up from one year ago and he states that he cannot take medications; states that Dr. Rayann Heman, his cardiologist, has already tried many and he doesn't want to attempt more.  Talked about fish oil and exercise.    -He will have a carotid ultrasound.  He sees Dr. Rayann Heman so we will have him read this.  -If doing well, I will see him yearly. 2.  Chronic left leg pain  -Although I do not know the etiology, he has had a very extensive workup and I would doubt that it would be from the prior Wallenberg infarction, as it is only down the posterior leg.  Nonetheless, even if it was, he has failed meds, injections and pain management.  Offered to have him see my  partner for c/s but has seen a different neuromuscular specialist in the past.  Will hold for now as is trying new, "alternative" PT

## 2014-01-15 ENCOUNTER — Other Ambulatory Visit (HOSPITAL_COMMUNITY): Payer: Self-pay | Admitting: *Deleted

## 2014-01-15 ENCOUNTER — Telehealth: Payer: Self-pay | Admitting: Neurology

## 2014-01-15 DIAGNOSIS — E785 Hyperlipidemia, unspecified: Secondary | ICD-10-CM

## 2014-01-15 DIAGNOSIS — G463 Brain stem stroke syndrome: Principal | ICD-10-CM

## 2014-01-15 DIAGNOSIS — M79605 Pain in left leg: Secondary | ICD-10-CM

## 2014-01-15 DIAGNOSIS — G459 Transient cerebral ischemic attack, unspecified: Secondary | ICD-10-CM

## 2014-01-15 NOTE — Telephone Encounter (Signed)
LMOM with Tommy Morse at [facility] to set up Carotid US. She is to call back with any questions.

## 2014-01-15 NOTE — Telephone Encounter (Signed)
Patient called and stated that you discussed him seeing Dr Posey Pronto for his leg pain. He is interested in seeing her. Okay to schedule?

## 2014-01-15 NOTE — Telephone Encounter (Signed)
Dr. Posey Pronto, can I discuss this case with you?

## 2014-01-15 NOTE — Telephone Encounter (Signed)
-----   Message from Roberts, DO sent at 01/14/2014  3:48 PM EST ----- Did I tell you pt needed carotid u/s with Dr. Rayann Heman to read?  Dx: hx of stroke, hyperlipidemia

## 2014-01-15 NOTE — Telephone Encounter (Signed)
Yes, I talked with Dr. Posey Pronto about this pt.  She likely will end up repeating EMG as well (tell pt as he had one done over a year ago).  In fact, may want to start with that.

## 2014-01-15 NOTE — Telephone Encounter (Signed)
Of course!

## 2014-01-15 NOTE — Telephone Encounter (Signed)
Carotid US order entered into [facility]. Will call [facility] to set up when they open.

## 2014-01-18 NOTE — Telephone Encounter (Signed)
Dr. Patel please advise.

## 2014-01-18 NOTE — Telephone Encounter (Signed)
To clarify, patient needs an EMG for leg pain, to follow up with Dr Posey Pronto after - is that correct?

## 2014-01-18 NOTE — Telephone Encounter (Signed)
It is the L leg, and ask Dr. Posey Pronto which order she would prefer.

## 2014-01-18 NOTE — Telephone Encounter (Signed)
90-min EMG, we'll look at both legs.

## 2014-01-19 NOTE — Telephone Encounter (Signed)
Patient made aware and he agrees to this plan. Order entered for EMG. Hinton Dyer or Sherri- Please schedule patient for EMG- bilateral legs 90 minutes and New patient appt after with Dr Posey Pronto.

## 2014-01-19 NOTE — Addendum Note (Signed)
Addended byAnnamaria Helling on: 01/19/2014 08:55 AM   Modules accepted: Orders

## 2014-01-25 ENCOUNTER — Ambulatory Visit (HOSPITAL_COMMUNITY): Payer: 59 | Attending: Cardiology | Admitting: Cardiology

## 2014-01-25 DIAGNOSIS — E785 Hyperlipidemia, unspecified: Secondary | ICD-10-CM | POA: Diagnosis not present

## 2014-01-25 DIAGNOSIS — E119 Type 2 diabetes mellitus without complications: Secondary | ICD-10-CM | POA: Diagnosis not present

## 2014-01-25 DIAGNOSIS — I6523 Occlusion and stenosis of bilateral carotid arteries: Secondary | ICD-10-CM

## 2014-01-25 DIAGNOSIS — Z8673 Personal history of transient ischemic attack (TIA), and cerebral infarction without residual deficits: Secondary | ICD-10-CM | POA: Insufficient documentation

## 2014-01-25 DIAGNOSIS — I10 Essential (primary) hypertension: Secondary | ICD-10-CM | POA: Diagnosis not present

## 2014-01-25 DIAGNOSIS — G459 Transient cerebral ischemic attack, unspecified: Secondary | ICD-10-CM

## 2014-01-25 DIAGNOSIS — I2510 Atherosclerotic heart disease of native coronary artery without angina pectoris: Secondary | ICD-10-CM | POA: Diagnosis not present

## 2014-01-25 NOTE — Progress Notes (Signed)
Carotid duplex performed 

## 2014-01-26 ENCOUNTER — Telehealth: Payer: Self-pay | Admitting: Neurology

## 2014-01-26 NOTE — Telephone Encounter (Signed)
-----   Message from Padroni, DO sent at 01/26/2014  7:52 AM EST ----- Luvenia Starch, tell pt that u/s looked fine and they didn't recommend repeat for 2 years, so we can hold visit for 2 years and cancel next years if he is doing well.

## 2014-01-26 NOTE — Telephone Encounter (Signed)
Patient made aware [place] looked good. He can follow up with [place] in 2 years and see [place] at that point. He agreed with this plan and will call with any concerns/questions.

## 2014-02-01 ENCOUNTER — Other Ambulatory Visit: Payer: Self-pay | Admitting: Internal Medicine

## 2014-02-11 ENCOUNTER — Ambulatory Visit (INDEPENDENT_AMBULATORY_CARE_PROVIDER_SITE_OTHER): Payer: 59 | Admitting: Neurology

## 2014-02-11 DIAGNOSIS — M79605 Pain in left leg: Secondary | ICD-10-CM

## 2014-02-11 NOTE — Procedures (Signed)
[facility]  [address], [place]  [place], [place] [address] Tel: [phone] Fax:  [phone] Test Date:  02/11/2014  Patient: Tommy Morse DOB: 04-17-1950 Physician: Narda Amber  Sex: Male Height: 6\' 1"  Ref Phys: Alonza Bogus  ID#: [id number] Temp: 32.6C Technician:    Patient Complaints: This is a 63 year-old gentleman s/p lumbar decompression and laminectomy at L5 presenting for evaluation of chronic left leg pain.  NCV & EMG Findings: Extensive electrodiagnostic testing of the left lower extremity and additional studies of the right shows:  1. Left sural and superficial peroneal sensory responses are within normal limits. 2. Left peroneal and tibial motor responses are within normal limits. 3. Bilateral H reflex studies are mildly prolonged and most likely due to patient's height. 4. Chronic motor axon loss changes are seen affecting the left L5 myotome, without accompanied active denervation. Similar findings are not seen in the right lower extremity.  Impression: 1. 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 2. There is no evidence of a sciatic mononeuropathy, localized myopathy, or generalized sensorimotor neuropathy affecting the left lower extremity.   ___________________________ Narda Amber, DO    Nerve Conduction Studies Anti Sensory Summary Table   Stim Site NR Peak (ms) Norm Peak (ms) P-T Amp (V) Norm P-T Amp  Left Sup Peroneal Anti Sensory (Ant Lat Mall)  12 cm    3.5 <4.6 4.5 >3  Left Sural Anti Sensory (Lat Mall)  Calf    3.8 <4.6 3.6 >3   Motor Summary Table   Stim Site NR Onset (ms) Norm Onset (ms) O-P Amp (mV) Norm O-P Amp Site1 Site2 Delta-0 (ms) Dist (cm) Vel (m/s) Norm Vel (m/s)  Left Peroneal Motor (Ext Dig Brev)  Ankle    4.7 <6.0 3.6 >2.5 B Fib Ankle 8.8 36.0 41 >40  B Fib    13.5  2.9  Poplt B Fib 2.2 9.0 41 >40  Poplt    15.7  2.9         Left Peroneal TA Motor (Tib Ant)   Fib Head    3.5 <4.5 4.7 >3 Poplit Fib Head 1.5 9.0 60 >40  Poplit    5.0  4.7         Left Tibial Motor (Abd Hall Brev)  Ankle    4.0 <6.0 5.5 >4 Knee Ankle 10.5 44.0 42 >40  Knee    14.5  3.5          H Reflex Studies   NR H-Lat (ms) Lat Norm (ms) L-R H-Lat (ms)  Left Tibial (Gastroc)     37.82 <35 0.82  Right Tibial (Gastroc)     37.01 <35 0.82   EMG   Side Muscle Ins Act Fibs Psw Fasc Number Recrt Dur Dur. Amp Amp. Poly Poly. Comment  Left AntTibialis Nml Nml Nml Nml 1- Mod-R Few 1+ Few 1+ Nml Nml N/A  Left Gastroc Nml Nml Nml Nml Nml Nml Nml Nml Nml Nml Nml Nml N/A  Left Flex Dig Long Nml Nml Nml Nml Nml Nml Nml Nml Nml Nml Nml Nml N/A  Left RectFemoris Nml Nml Nml Nml Nml Nml Nml Nml Nml Nml Nml Nml N/A  Left GluteusMed Nml Nml Nml Nml 1- Mod-R Few 1+ Few 1+ Nml Nml N/A  Left BicepsFemS Nml Nml Nml Nml Nml Nml Nml Nml Nml Nml Nml Nml N/A  Left Semimembranosus Nml Nml Nml Nml 1- Mod-R Few 1+ Few 1+ Nml Nml N/A  Right AntTibialis Nml Nml Nml Nml Nml Nml Nml Nml Nml Nml Nml Nml N/A  Right GluteusMed Nml Nml Nml Nml Nml Nml Nml Nml Nml Nml Nml Nml N/A      Waveforms:

## 2014-02-12 ENCOUNTER — Telehealth: Payer: Self-pay | Admitting: Neurology

## 2014-02-12 DIAGNOSIS — M79605 Pain in left leg: Secondary | ICD-10-CM

## 2014-02-12 NOTE — Telephone Encounter (Signed)
Patient made aware. He is okay with referral to Dr Tamala Julian. Appt made for 03/03/2013 at 9:15 am. Patient made aware and given address and phone number. He will call with any questions.

## 2014-02-12 NOTE — Telephone Encounter (Signed)
Jade, EMG shows very mild L5 radic on the left which is likely old from his previous surgery. Nothing apparent to explain the severity of his leg pain based on EMG.  I spoke with Dr. Posey Pronto and she suggested referral to Dr. Tamala Julian to see if perhaps this is piriformis syndrome although the length this has been going on is a bit long

## 2014-02-17 ENCOUNTER — Other Ambulatory Visit: Payer: Self-pay | Admitting: Internal Medicine

## 2014-02-25 ENCOUNTER — Telehealth: Payer: Self-pay | Admitting: Family Medicine

## 2014-02-25 MED ORDER — INSULIN DETEMIR 100 UNIT/ML FLEXPEN: PEN_INJECTOR | SUBCUTANEOUS | Status: DC

## 2014-02-25 MED ORDER — GLUCOSE BLOOD VI STRP: ORAL_STRIP | Status: AC

## 2014-02-25 NOTE — Telephone Encounter (Signed)
Patient is out of [place]. He does not have a dose for tonight.

## 2014-02-25 NOTE — Telephone Encounter (Signed)
Rxs sent

## 2014-03-03 ENCOUNTER — Ambulatory Visit (INDEPENDENT_AMBULATORY_CARE_PROVIDER_SITE_OTHER): Payer: 59 | Admitting: Family Medicine

## 2014-03-03 ENCOUNTER — Encounter: Payer: Self-pay | Admitting: Family Medicine

## 2014-03-03 ENCOUNTER — Other Ambulatory Visit (INDEPENDENT_AMBULATORY_CARE_PROVIDER_SITE_OTHER): Payer: 59

## 2014-03-03 VITALS — BP 108/68 | HR 68 | Ht 73.0 in | Wt 209.0 lb

## 2014-03-03 DIAGNOSIS — M79605 Pain in left leg: Secondary | ICD-10-CM

## 2014-03-03 DIAGNOSIS — G5702 Lesion of sciatic nerve, left lower limb: Secondary | ICD-10-CM

## 2014-03-03 NOTE — Progress Notes (Signed)
[facility] [address] [place], [place] [address] Phone: [phone] Subjective:    I'm seeing this patient by the request  of: Dr.  Carles Collet  CC: left leg pain.   [id number] Tommy Morse is a 64 y.o. male coming in with complaint of  Left chronic buttock pain. Patient has had this pain for 3/2 years after a lawnmower accident. Patient states that the pain does radiate down the posterior aspect of his leg. Patient has had extensive workup by multiple different orthopedics and neurologist. Patient has had epidural steroid injections as well as oral medications including gabapentin and Lyrica without any significant improvement. Patient recently had an EMG that was fairly unremarkable except for an L5 mild neuropathy likely secondary to patient's previous surgery. Patient is also done acupuncture and chiropractor. Has had dry needling. Patient has not had any significant decrease in pain. This is affecting his daily activities and is unable to work out secondary to this pain. Patient describes it as a dull aching sensation that hurts with walking or with rest. Patient denies that is waking him up at night. Patient does not take any over-the-counter medicines either. Patient is on an anticoagulant secondary to heart condition. Patient rates the pain is 8 out of 10 in severity. Patient feels like something is a be done. He has been recommended to have a subcutaneous stimulator but he would like to avoid this. Patient though is not opposed to surgery if necessary. Patient is even looked in the things such as stem cell injections.  Reviewing patient's previous imaging 04/12/2011 CT scan of the lumbar spine showed mild L4-L5 bilateral laminotomies, posterior decompression as well as an L5-S1 right eccentric broad-based central disc protrusion contacting the descending right S1 nerve. Since this imaging patient has had multiple epidural steroid injections without any  significant relief. Patient is also had a bone scan in September 2014 bone scan was essentially unremarkable.    Past medical history, social, surgical and family history all reviewed in electronic medical record.   Review of Systems: No headache, visual changes, nausea, vomiting, diarrhea, constipation, dizziness, abdominal pain, skin rash, fevers, chills, night sweats, weight loss, swollen lymph nodes, body aches, joint swelling, muscle aches, chest pain, shortness of breath, mood changes.   Objective Blood pressure 108/68, pulse 68, height 6\' 1"  (1.854 m), weight 209 lb (94.802 kg), SpO2 94 %.  General: No apparent distress alert and oriented x3 mood and affect normal, dressed appropriately.  HEENT: Pupils equal, extraocular movements intact  Respiratory: Patient's speak in full sentences and does not appear short of breath  Cardiovascular: No lower extremity edema, non tender, no erythema  Skin: Warm dry intact with no signs of infection or rash on extremities or on axial skeleton.  Abdomen: Soft nontender  Neuro: Cranial nerves II through XII are intact, neurovascularly intact in all extremities with 2+ DTRs and 2+ pulses.  Lymph: No lymphadenopathy of posterior or anterior cervical chain or axillae bilaterally.  Gait normal with good balance and coordination.  MSK:  Non tender with full range of motion and good stability and symmetric strength and tone of shoulders, elbows, wrist, , knee and ankles bilaterally.  Hip: Left ROM IR: 25 Deg, ER: 25 Deg severe tightness compared to the contralateral side, Flexion: 120 Deg, Extension: 100 Deg, Abduction: 45 Deg, Adduction: 45 Deg Strength IR: 4/5, ER: 4/5, Flexion: 5/5, Extension: 5/5, Abduction: 4/5, Adduction: 5/5 Pelvic alignment unremarkable to inspection and palpation. Standing hip rotation  and gait without trendelenburg sign / unsteadiness. Greater trochanter without tenderness to palpation. No tenderness over piriformis and greater  trochanter. Positive Faber with severe decrease in range of motion. Tenderness over the left SI joint Contralateral hip unremarkable  MSK US performed of: Left hip This study was ordered, performed, and interpreted by Charlann Boxer D.O.  Hip: Trochanteric bursa without swelling or effusion.  patient's piriformis area does have an area of hypoechoic changes is likely a calcific change within the piriformis approximately 1 cm from insertion on the sacrum. There is hypoechoic changes surrounding the muscle as well as the sciatic nerve which seems to have some chronic irritation.  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     Impression and Recommendations:     This case required medical decision making of moderate complexity.

## 2014-03-03 NOTE — Assessment & Plan Note (Signed)
Patient has had multiple different modalities. Patient has had epidural steroid injections and based on patient's history he states he has had a piriformis injection previously. It appears that in the patient's history that this may been something that had resolved some of the pain. On findings today I do think that the piriformis syndrome as well as the chronic irritation of the sciatic nerve could be contributing to his pain. I do see calcific changes in this area that can also be why this is more of a chronic in today. We discussed different treatment options and patient elected for some conservative therapy including home exercises, over-the-counter medications and we discussed a heel lift in the right shoe to avoid significant impact on the left side. We discussed which activities could be beneficial as well. Patient is going to try to make these changes and come back in 3 weeks. Other treatment options patient has includes a ultrasound guided injection of the piriformis for diagnostic as well as hopefully therapeutic purposes. Patient may also do well with either a PRP injection versus possibly Botox injections. We will discuss further at follow-up. Further imaging including hip x-ray as well as potentially MRI may be necessary as well as this continues to be difficult.

## 2014-03-03 NOTE — Patient Instructions (Addendum)
Good to see you Ice 20 minutes 2 times daily. Usually after activity and before bed. Vitamin D 2000 IU daily.  Turmeric 500mg  daily.  B12 1071mcg daily.  For exercise biking or swimming.  Exercises 3 times a week.  See me again in 3 weeks. We can consider an injection to make sure we are treating the right thing.

## 2014-03-19 ENCOUNTER — Ambulatory Visit: Payer: 59 | Admitting: Family Medicine

## 2014-03-23 ENCOUNTER — Ambulatory Visit (INDEPENDENT_AMBULATORY_CARE_PROVIDER_SITE_OTHER): Payer: 59 | Admitting: Family Medicine

## 2014-03-23 ENCOUNTER — Encounter: Payer: Self-pay | Admitting: Family Medicine

## 2014-03-23 VITALS — BP 121/85 | HR 69 | Temp 98.1°F | Resp 18 | Ht 72.0 in | Wt 205.0 lb

## 2014-03-23 DIAGNOSIS — N183 Chronic kidney disease, stage 3 unspecified: Secondary | ICD-10-CM

## 2014-03-23 DIAGNOSIS — I10 Essential (primary) hypertension: Secondary | ICD-10-CM

## 2014-03-23 DIAGNOSIS — H00013 Hordeolum externum right eye, unspecified eyelid: Secondary | ICD-10-CM

## 2014-03-23 DIAGNOSIS — Z8673 Personal history of transient ischemic attack (TIA), and cerebral infarction without residual deficits: Secondary | ICD-10-CM | POA: Insufficient documentation

## 2014-03-23 DIAGNOSIS — N189 Chronic kidney disease, unspecified: Secondary | ICD-10-CM

## 2014-03-23 DIAGNOSIS — E785 Hyperlipidemia, unspecified: Secondary | ICD-10-CM

## 2014-03-23 DIAGNOSIS — E119 Type 2 diabetes mellitus without complications: Secondary | ICD-10-CM

## 2014-03-23 LAB — HEMOGLOBIN A1C: Hgb A1c MFr Bld: 9.1 % — ABNORMAL HIGH (ref 4.6–6.5)

## 2014-03-23 MED ORDER — INSULIN LISPRO 100 UNIT/ML (KWIKPEN): PEN_INJECTOR | SUBCUTANEOUS | Status: DC

## 2014-03-23 MED ORDER — ERYTHROMYCIN 5 MG/GM OP OINT: 1.0000 "application " | TOPICAL_OINTMENT | Freq: Three times a day (TID) | OPHTHALMIC | Status: DC

## 2014-03-23 NOTE — Progress Notes (Signed)
OFFICE NOTE  03/23/2014  CC:  Chief Complaint  Patient presents with  . Follow-up    fasting  . Eye Problem    stye in R eye   HPI: Patient is a 64 y.o. Caucasian male who is here for 3 mo f)/u DM 2, HTN, hyperlipidemia, hx of CVA.  Acute complaint: right eye upper eyelid red, swollen, slightly uncomfortable feeling for the last 3d.  Little pimple-type lesion near base of eyelash has drained a little, otherwise no eye drainage.  It has improved a little the last 24h or so.  No vision deficit, no fever.  No recent URI sx's.  DM 2: Taking levemir and humalog (started humalog after most recent labs 4 mo ago). Currently giving 10 U humalog each meal as a typical dose, still feeling frustrated with erratic control.  He asks for referral to Dr. Forde Dandy, endocrinologist today.    Hyperlipidemia:  Lab Results  Component Value Date   CHOL 344* 11/20/2013   HDL 33.40* 11/20/2013   LDLCALC 133* 12/11/2012   LDLDIRECT 242.6 11/20/2013   TRIG 283.0* 11/20/2013   CHOLHDL 10 11/20/2013   Pt has long been determined intolerant of statins and refuses further trial of these meds.  He has been working on diet.  Exercise has been hindered by chronic left leg sciatica pain.  HTN: occ bp check, pt says usually very good.  Hx of CVA: recent carotid u/s showed no change compared to prior: just mild bilat ICA stenosis.  Repeat in 2 yrs recommended.  He remains on plavix.  Pertinent PMH:  Past medical, surgical, social, and family history reviewed and no changes are noted since last office visit.  MEDS:  Outpatient Prescriptions Prior to Visit  Medication Sig Dispense Refill  . clopidogrel (PLAVIX) 75 MG tablet TAKE 1 TABLET (75 MG TOTAL) BY MOUTH DAILY. 90 tablet 1  . gemfibrozil (LOPID) 600 MG tablet TAKE 1 TABLET (600 MG TOTAL) BY MOUTH 2 (TWO) TIMES DAILY BEFORE A MEAL. 60 tablet 9  . hydrochlorothiazide (HYDRODIURIL) 25 MG tablet TAKE 1 TABLET BY MOUTH DAILY 90 tablet 1  . Insulin Detemir  (LEVEMIR FLEXTOUCH) 100 UNIT/ML Pen 60 U SQ qhs 30 mL 3  . metoprolol (LOPRESSOR) 50 MG tablet TAKE 1 AND 1/2 TABLETS BY MOUTH TWICE A DAY 270 tablet 1  . Multiple Vitamin (MULTIVITAMIN) capsule Take 1 capsule by mouth daily.    . pantoprazole (PROTONIX) 40 MG tablet Take 1 tablet (40 mg total) by mouth 2 (two) times daily. 180 tablet 3  . ramipril (ALTACE) 10 MG capsule TAKE 1 CAPSULE (10 MG TOTAL) BY MOUTH DAILY. 30 capsule 5  . vitamin B-12 (CYANOCOBALAMIN) 100 MCG tablet Take 50 mcg by mouth daily.    . insulin lispro (HUMALOG) 100 UNIT/ML KiwkPen Take as directed by MD 15 mL 6  . glucose blood (ONE TOUCH ULTRA TEST) test strip TEST TWICE A DAY 50 each 3  . Insulin Pen Needle (NOVOFINE) 32G X 6 MM MISC Use with insulin pen (one at bedtime and one at each meal= 4 per day) 100 each 12   No facility-administered medications prior to visit.    PE: Blood pressure 121/85, pulse 69, temperature 98.1 F (36.7 C), temperature source Temporal, resp. rate 18, height 6' (1.829 m), weight 205 lb (92.987 kg), SpO2 96 %. Gen: Alert, well appearing.  Patient is oriented to person, place, time, and situation. Eyes: right upper eyelid with hordeolum near the base of an eyelash, with diffuse erythema  and swelling of upper eyelid. No bulbar conjunctival injection, no eye exudate.  Normal EOM. CV: RRR, no m/r/g LUNGS: CTA bilat, nonlabored resps  LAB: none today RECENT: Lab Results  Component Value Date   HGBA1C 10.8* 11/20/2013     Chemistry      Component Value Date/Time   NA 136 11/20/2013 0844   K 4.4 11/20/2013 0844   CL 100 11/20/2013 0844   CO2 26 11/20/2013 0844   BUN 27* 11/20/2013 0844   CREATININE 1.5 11/20/2013 0844   CREATININE 1.40* 12/11/2012 0830      Component Value Date/Time   CALCIUM 10.0 11/20/2013 0844   ALKPHOS 71 07/28/2013 1452   AST 22 07/28/2013 1452   ALT 27 07/28/2013 1452   BILITOT 0.9 07/28/2013 1452     Lab Results  Component Value Date   WBC 7.3  07/28/2013   HGB 14.5 07/28/2013   HCT 42.8 07/28/2013   MCV 88.9 07/28/2013   PLT 291.0 07/28/2013    IMPRESSION AND PLAN:  1) Right eye upper lid hordeolum: erythromycin ointment tid x 7d, + J&J baby shampoo soaks 1-2 times a day.  2) DM 2, improving control per home gluc monitoring. HbA1c today.  Referral to endocrinologist, Dr. Forde Dandy, as per pt request today for ongoing DM management.  3) CRI stage 3 and HTN: bp stable.  Continue current meds. Check BMET today.  4) Hyperlipidemia, mixed: we have accepted the fact that dietary focus + gemfibrozil are our only combatants against this for him since he is intolerant of statins and will do no further trials.  Exercise is difficult due to his chronic left leg sciatica pain.  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  Continue to try our best at RF control, continue plavix. Recent carotid u/s with minimal dz, unchanged--repeat about 12/2015.  An After Visit Summary was printed and given to the patient.  FOLLOW UP: 4 mo

## 2014-03-23 NOTE — Patient Instructions (Signed)
Apply Wynetta Emery and Wynetta Emery baby shampoo warm compress/soak to right eye for 20 min 1-2 times per day.

## 2014-03-23 NOTE — Progress Notes (Signed)
Pre visit review using our clinic review tool, if applicable. No additional management support is needed unless otherwise documented below in the visit note. 

## 2014-03-24 ENCOUNTER — Ambulatory Visit (INDEPENDENT_AMBULATORY_CARE_PROVIDER_SITE_OTHER): Payer: 59 | Admitting: Family Medicine

## 2014-03-24 ENCOUNTER — Telehealth: Payer: Self-pay | Admitting: Family Medicine

## 2014-03-24 ENCOUNTER — Encounter: Payer: Self-pay | Admitting: Family Medicine

## 2014-03-24 ENCOUNTER — Other Ambulatory Visit: Payer: Self-pay | Admitting: Internal Medicine

## 2014-03-24 ENCOUNTER — Other Ambulatory Visit (INDEPENDENT_AMBULATORY_CARE_PROVIDER_SITE_OTHER): Payer: 59

## 2014-03-24 VITALS — BP 114/78 | HR 69 | Ht 73.0 in | Wt 207.0 lb

## 2014-03-24 DIAGNOSIS — M79605 Pain in left leg: Secondary | ICD-10-CM

## 2014-03-24 DIAGNOSIS — G5702 Lesion of sciatic nerve, left lower limb: Secondary | ICD-10-CM

## 2014-03-24 LAB — BASIC METABOLIC PANEL
BUN: 26 mg/dL — ABNORMAL HIGH (ref 6–23)
CO2: 29 meq/L (ref 19–32)
CREATININE: 1.4 mg/dL (ref 0.40–1.50)
Calcium: 10.1 mg/dL (ref 8.4–10.5)
Chloride: 97 mEq/L (ref 96–112)
GFR: 54.12 mL/min — AB (ref >60.00)
Glucose, Bld: 142 mg/dL — ABNORMAL HIGH (ref 70–99)
Potassium: 4.4 mEq/L (ref 3.5–5.1)
SODIUM: 135 meq/L (ref 135–145)

## 2014-03-24 NOTE — Telephone Encounter (Signed)
Returning a call. He would like his blood work results./ [facility]

## 2014-03-24 NOTE — Patient Instructions (Addendum)
Great to see you We tried an injection today around the calcium Ice in 6 hours.  Continue the vitamins Consider some type of message mechanical in the area for next couple days to help break up the calcium Call me in 1 week tell me how you are doing Lay off exercises for 72 hours.  See me again in 2 weeks.

## 2014-03-24 NOTE — Telephone Encounter (Signed)
Returned pt's call. Results given.  

## 2014-03-24 NOTE — Progress Notes (Signed)
[facility] [place], [place] [address] Phone: [phone] Subjective:     CC: left leg pain follow up  YIF:OYDXAJOINO Tommy Morse is a 64 y.o. male coming in with complaint of  Left chronic buttock pain.  Please see previous note for background on this injury. Patient was found to have a calcific change of the piriformis muscle. Patient has had significant workup with this before. Patient was given exercises specifically for the piriformis muscle as well as different over-the-counter medications a could be beneficial. We discussed manual massage that could also be helpful. Patient states that he is not made any significant improvement with the modalities we have tried. Patient would like to try something more aggressive.  Reviewing patient's previous imaging 04/12/2011 CT scan of the lumbar spine showed mild L4-L5 bilateral laminotomies, posterior decompression as well as an L5-S1 right eccentric broad-based central disc protrusion contacting the descending right S1 nerve. Since this imaging patient has had multiple epidural steroid injections without any significant relief. Patient is also had a bone scan in September 2014 bone scan was essentially unremarkable.    Past medical history, social, surgical and family history all reviewed in electronic medical record.   Review of Systems: No headache, visual changes, nausea, vomiting, diarrhea, constipation, dizziness, abdominal pain, skin rash, fevers, chills, night sweats, weight loss, swollen lymph nodes, body aches, joint swelling, muscle aches, chest pain, shortness of breath, mood changes.   Objective Blood pressure 114/78, pulse 69, height 6\' 1"  (1.854 m), weight 207 lb (93.895 kg), SpO2 98 %.  General: No apparent distress alert and oriented x3 mood and affect normal, dressed appropriately.  HEENT: Pupils equal, extraocular movements intact  Respiratory: Patient's speak in full sentences  and does not appear short of breath  Cardiovascular: No lower extremity edema, non tender, no erythema  Skin: Warm dry intact with no signs of infection or rash on extremities or on axial skeleton.  Abdomen: Soft nontender  Neuro: Cranial nerves II through XII are intact, neurovascularly intact in all extremities with 2+ DTRs and 2+ pulses.  Lymph: No lymphadenopathy of posterior or anterior cervical chain or axillae bilaterally.  Gait normal with good balance and coordination.  MSK:  Non tender with full range of motion and good stability and symmetric strength and tone of shoulders, elbows, wrist, , knee and ankles bilaterally.  Hip: Left ROM IR: 25 Deg, ER: 25 Deg severe tightness compared to the contralateral side, Flexion: 120 Deg, Extension: 100 Deg, Abduction: 45 Deg, Adduction: 45 Deg Strength IR: 4/5, ER: 4/5, Flexion: 5/5, Extension: 5/5, Abduction: 4/5, Adduction: 5/5 Pelvic alignment unremarkable to inspection and palpation. Standing hip rotation and gait without trendelenburg sign / unsteadiness. Greater trochanter without tenderness to palpation. No tenderness over piriformis and greater trochanter. Positive Faber with severe decrease in range of motion. Tenderness over the left SI joint Contralateral hip unremarkable No change from previous exam  MSK US performed of: Left hip This study was ordered, performed, and interpreted by Charlann Boxer D.O.  Hip: Trochanteric bursa without swelling or effusion.  patient's piriformis area does have an area of hypoechoic changes is likely a calcific change within the piriformis approximately 1 cm from insertion on the sacrum. There is hypoechoic changes surrounding the muscle as well as the sciatic nerve which seems to have some chronic irritation.  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   Procedure: Real-time Ultrasound Guided Injection  of ossific change within the left  piriformis muscle Device: GE Logiq E  Ultrasound guided injection is preferred based studies that show increased duration, increased effect, greater accuracy, decreased procedural pain, increased response rate, and decreased cost with ultrasound guided versus blind injection.  Verbal informed consent obtained.  Time-out conducted.  Noted no overlying erythema, induration, or other signs of local infection.  Skin prepped in a sterile fashion.  Local anesthesia: Topical Ethyl chloride.  With sterile technique and under real time ultrasound guidance:  With a 21-gauge 3 inch needle patient was injected with 2 mL of 0.5% Marcaine and 1 mL of Kenalog 40 g/dL into the calcium area. This is patient's maximal tenderness. Care was taken to avoid patient's sciatic nerve. We did attempt to break up some the calcium that was noted. Completed without difficulty  Pain mildly improved after injection. Patient able to ambulate without any significant trouble. Advised to call if fevers/chills, erythema, induration, drainage, or persistent bleeding.  Images permanently stored and available for review in the ultrasound unit.  Impression: Technically successful ultrasound guided injection.     Impression and Recommendations:     This case required medical decision making of moderate complexity.

## 2014-03-24 NOTE — Telephone Encounter (Signed)
emmi emailed °

## 2014-03-24 NOTE — Assessment & Plan Note (Signed)
Attempted an injection today to try to break up some of the calcium surrounding the sciatic nerve that is likely contributing to some of his difficulty. I do think that patient has had a significant workup previously. I'm hoping that this is beneficial.. Patient with mild benefit of the swelling had more of a diagnosis. Patient does not have any improvement we need to consider a PRP injection or further advanced imaging. Patient encouraged to continue the exercises and we discussed manual massage. Patient discussed the possibility of lithotripsy.

## 2014-03-26 ENCOUNTER — Other Ambulatory Visit: Payer: Self-pay | Admitting: Family Medicine

## 2014-03-26 DIAGNOSIS — K219 Gastro-esophageal reflux disease without esophagitis: Secondary | ICD-10-CM

## 2014-03-26 MED ORDER — PANTOPRAZOLE SODIUM 40 MG PO TBEC: 40.0000 mg | DELAYED_RELEASE_TABLET | Freq: Two times a day (BID) | ORAL | Status: DC

## 2014-03-31 ENCOUNTER — Telehealth: Payer: Self-pay | Admitting: *Deleted

## 2014-03-31 NOTE — Telephone Encounter (Signed)
Pt left msg stating that after the injection at the last OV he is not doing any better. What is the next step?

## 2014-04-02 NOTE — Telephone Encounter (Signed)
Discussed with patient over the phone. Discuss with him about the possibility of lithotripsy and we are looking into this. Depending on the we'll either consider seeing if we can try that versus the potential of a PRP injection. Once we have more information we'll call patient and set up follow-up.

## 2014-04-06 ENCOUNTER — Encounter: Payer: Self-pay | Admitting: Endocrinology

## 2014-04-06 ENCOUNTER — Ambulatory Visit (INDEPENDENT_AMBULATORY_CARE_PROVIDER_SITE_OTHER): Payer: 59 | Admitting: Endocrinology

## 2014-04-06 ENCOUNTER — Telehealth: Payer: Self-pay | Admitting: Family Medicine

## 2014-04-06 VITALS — BP 115/67 | HR 63 | Temp 98.6°F | Resp 16 | Ht 73.0 in | Wt 203.6 lb

## 2014-04-06 DIAGNOSIS — E1165 Type 2 diabetes mellitus with hyperglycemia: Secondary | ICD-10-CM

## 2014-04-06 DIAGNOSIS — IMO0002 Reserved for concepts with insufficient information to code with codable children: Secondary | ICD-10-CM

## 2014-04-06 DIAGNOSIS — E1169 Type 2 diabetes mellitus with other specified complication: Secondary | ICD-10-CM

## 2014-04-06 DIAGNOSIS — N521 Erectile dysfunction due to diseases classified elsewhere: Secondary | ICD-10-CM

## 2014-04-06 DIAGNOSIS — I10 Essential (primary) hypertension: Secondary | ICD-10-CM

## 2014-04-06 DIAGNOSIS — E785 Hyperlipidemia, unspecified: Secondary | ICD-10-CM

## 2014-04-06 MED ORDER — SILDENAFIL CITRATE 20 MG PO TABS: ORAL_TABLET | ORAL | Status: DC

## 2014-04-06 NOTE — Telephone Encounter (Signed)
Spoke with pt, he would like to try PRP. Pt is scheduled & aware insurance does not cover & he will need to pay $475 out of pocket.

## 2014-04-06 NOTE — Telephone Encounter (Signed)
Would like to know what is going on with treatment.

## 2014-04-06 NOTE — Progress Notes (Signed)
Patient ID: Tommy Morse, male   DOB: [birth year], 64 y.o.   MRN: [id number]           Reason for Appointment: Consultation for Type 2 Diabetes  Referring physician:  History of Present Illness:          Diagnosis: Type 2 diabetes mellitus, date of diagnosis: ?  2011        Past history: He is not sure about when he was diagnosed to have diabetes. He thinks he was started on glipizide and metformin at the time of diagnosis.  He had not been started on glucose monitoring for a while Also has not had any diabetes education in the past. Apparently because of significantly high A1c in 07/2013 he was started on Levemir insulin which had been increased progressively over the next few months.  His metformin and glipizide was stopped.   Because of inadequate response and persistent high readings he was also given Humalog low doses in 10/2013  Recent history: Despite taking basal bolus insulin regimen his A1c in 1/16 was 9.1 and he is referred here for further management. He has been taking about the same amount of Levemir for a couple months, previously the dose had been progressively escalated He thinks he is usually watching his diet with small portions and lower amounts of carbohydrates Did not bring his blood sugar monitor for download but does not appear to be having very high readings at home. His blood sugars in the mornings have been trending downwards and more recently having low normal without hypoglycemia overnight. He thinks his blood sugars are relatively higher at suppertime and bedtime. He is adjusting his mealtime insulin mostly based on pre-meal blood sugar other than what he is eating, taking relatively larger dose at lunch and smallest dose at breakfast. He has not been able to exercise because of sciatica type pain. He does appear to be more motivated to improve his control       Oral hypoglycemic drugs the patient is taking are: None      INSULIN regimen is described as:   Levemir 60 units at bedtime.  Humalog 5-6 units at breakfast, 10 units at lunch and 7-9 units at supper   Compliance with the medical regimen: Good  Hypoglycemia:   none  Glucose monitoring:  done 1-2 time a day         Glucometer: One Touch.      Blood Glucose readings by time of day by recall  PREMEAL Breakfast Lunch Dinner Bedtime  Overall   Glucose range: 65-90 130 160 160   Median:         Self-care: The diet that the patient has been following is: tries to limit carbohydrates and fats .     Meals: 3 meals per day. Breakfast is an egg or yogurt with a carbohydrate like dosed/oatmeal/injuries muffin.  Lunch is a sandwich.  Dinner is chicken or meat with sweet potato and vegetables.  May have a few pretzels or Cheetos for snacks           Exercise: minimal, limited by leg pain          Dietician visit, most recent: Never but is interested in seeing one .               Weight history: Previous range 200-210  Wt Readings from Last 3 Encounters:  04/06/14 203 lb 9.6 oz (92.352 kg)  03/24/14 207 lb (93.895 kg)  03/23/14 205 lb (92.987  kg)    Glycemic control:   Lab Results  Component Value Date   HGBA1C 9.1* 03/23/2014   HGBA1C 10.8* 11/20/2013   HGBA1C 10.2* 07/28/2013   Lab Results  Component Value Date   MICROALBUR 2.5* 07/28/2013   LDLCALC 133* 12/11/2012   CREATININE 1.40 03/23/2014         Medication List       This list is accurate as of: 04/06/14  9:25 AM.  Always use your most recent med list.               clopidogrel 75 MG tablet  Commonly known as:  PLAVIX  TAKE 1 TABLET (75 MG TOTAL) BY MOUTH DAILY.     erythromycin ophthalmic ointment  Commonly known as:  ROMYCIN  Place 1 application into the right eye 3 (three) times daily.     gemfibrozil 600 MG tablet  Commonly known as:  LOPID  TAKE 1 TABLET (600 MG TOTAL) BY MOUTH 2 (TWO) TIMES DAILY BEFORE A MEAL.     glucose blood test strip  Commonly known as:  ONE TOUCH ULTRA TEST  TEST TWICE A  DAY     hydrochlorothiazide 25 MG tablet  Commonly known as:  HYDRODIURIL  TAKE 1 TABLET BY MOUTH DAILY     Insulin Detemir 100 UNIT/ML Pen  Commonly known as:  LEVEMIR FLEXTOUCH  60 U SQ qhs     insulin lispro 100 UNIT/ML KiwkPen  Commonly known as:  HUMALOG  10 U qAC     Insulin Pen Needle 32G X 6 MM Misc  Commonly known as:  NOVOFINE  Use with insulin pen (one at bedtime and one at each meal= 4 per day)     metoprolol 50 MG tablet  Commonly known as:  LOPRESSOR  TAKE 1 AND 1/2 TABLETS BY MOUTH TWICE A DAY     multivitamin capsule  Take 1 capsule by mouth daily.     pantoprazole 40 MG tablet  Commonly known as:  PROTONIX  Take 1 tablet (40 mg total) by mouth 2 (two) times daily.     ramipril 10 MG capsule  Commonly known as:  ALTACE  TAKE 1 CAPSULE (10 MG TOTAL) BY MOUTH DAILY.     vitamin B-12 100 MCG tablet  Commonly known as:  CYANOCOBALAMIN  Take 50 mcg by mouth daily.        Allergies:  Allergies  Allergen Reactions  . Amiodarone Other (See Comments)    "Made me Dr. Jack Quarto and [name]."  . Amoxicillin-Pot Clavulanate Rash  . Statins Other (See Comments)    "Puts me in a funk."    Past Medical History  Diagnosis Date  . Hypertension   . Hyperlipemia, mixed     intolerance to statins  . LVH (left ventricular hypertrophy)   . Hypertriglyceridemia     Triglycerides 741 March '13 - Initiated on Gemfibrozil  . Diabetes mellitus     A1C 7.2 Aug '13, microalbumin slightly elevated  . Coronary artery disease     CABG - x6 - 2008, - Lexiscan myoview 05/14/11 no inducible ischemia, EF 68%  . 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 Left sided sciatica 2012    neuropathic meds no help/side effects.  Has seen multiple specialists.  EMG neg.  Dr. Charlann Boxer 02/2014: "Chronic calcific changes of the piriformis and chronic irritation of the sciatic nerve"-treatment  and w/u  ongoing.  . Left hip pain 2012    Dr. Ramos--injections no help---long record of specialists ([place] ortho did CT lumbar myelogram and EMG/NCS.  Neurology did MRI L spine), still no final explanation and spinal stimulator may be ultimate treatment  . BPH (benign prostatic hypertrophy)   . History of balanitis     pt uncircumcised  . GERD (gastroesophageal reflux disease)   . History of adenomatous polyp of colon 03/2013    Recall 5 yrs  . Hypogonadism male     Testost implants --testopel-- 05/2013 (Dr. Ivory Broad [phone])  . Chronic renal insufficiency, stage III (moderate) 11/20/2013    CrCl 50s    Past Surgical History  Procedure Laterality Date  . Cardiac catheterization  04/19/06    EF 60-65%  . 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 Laminectomy  06/2009    L3-L4  . Back surgery  2013  . Prostate surgery      ?TURP--need old records  . Esophagogastroduodenoscopy (egd) with esophageal dilation  01/2013    EGD normal (no stricture) but dilation done for pt's dysphagia  . Colonoscopy w/ polypectomy  03/2013    Tubular adenoma; repeat 5 yrs  . Carotid u/s  12/2013    Stable 1-39% bilateral ICA stenosis.  O/w unremarkable.  Recommended repeat in 2 yrs (12/2015).  . Transthoracic echocardiogram  2008    Mild LVH, normal systolic fxn/normal wall motion, +diastolic dysfxn.    Family History  Problem Relation Age of Onset  . Heart disease Mother     valvular heart disease / Strong Family History  . Heart attack Mother   . Heart attack Father   . Hypertension Brother   . Colon cancer Neg Hx   . Esophageal cancer Neg Hx   . Stomach cancer Neg Hx   . Rectal cancer Neg Hx     Social History:  reports that he has never smoked. He has never used smokeless tobacco. He reports that he does not drink alcohol or use illicit drugs.    Review of Systems       Vision is normal. Most recent eye exam was        Lipids:  Currently taking only Gemfibrozil.  He thinks he has  aching of joints and muscles with statin drugs including Livalo  Is not on any specific treatment for high LDL       Lab Results  Component Value Date   CHOL 344* 11/20/2013   HDL 33.40* 11/20/2013   LDLCALC 133* 12/11/2012   LDLDIRECT 242.6 11/20/2013   TRIG 283.0* 11/20/2013   CHOLHDL 10 11/20/2013                  Skin: No rash or infections     Thyroid:  No  unusual fatigue.  No history of thyroid disease     The blood pressure has been controlled with HCTZ 25 mg, ramipril and metoprolol.     No swelling of feet.     No shortness of breath   No palpitations or chest tightness  on exertion.     Bowel habits: Slightly constipated, chronic.      No joint  pains.  Has had persistent left leg pain radiating from buttock area to lower leg with mild numbness also         No history of Numbness, tingling or burning in feet       .  He complains of frequent urination  during the day and some during the night also.      He has been told by urologist to have hypogonadism, etiology not clear.  Previously was complaining of low energy and feels better with testosterone pellets  He has had erectile dysfunction with relatively inadequate erections, has not discussed treatment for this.  No history of anxiety or depression.  He reportedly had a CVA in 2013   LABS:  No visits with results within 1 Week(s) from this visit. Latest known visit with results is:  Office Visit on 03/23/2014  Component Date Value Ref Range Status  . Hgb A1c MFr Bld 03/23/2014 9.1* 4.6 - 6.5 % Final   Glycemic Control Guidelines for People with Diabetes:Non Diabetic:  <6%Goal of Therapy: <7%Additional Action Suggested:  >8%   . Sodium 03/23/2014 135  135 - 145 mEq/L Final  . Potassium 03/23/2014 4.4  3.5 - 5.1 mEq/L Final  . Chloride 03/23/2014 97  96 - 112 mEq/L Final  . CO2 03/23/2014 29  19 - 32 mEq/L Final  . Glucose, Bld 03/23/2014 142* 70 - 99 mg/dL Final  . BUN 03/23/2014 26* 6 - 23 mg/dL  Final  . Creatinine, Ser 03/23/2014 1.40  0.40 - 1.50 mg/dL Final  . Calcium 03/23/2014 10.1  8.4 - 10.5 mg/dL Final  . GFR 03/23/2014 54.12* >60.00 mL/min Final    Physical Examination:  BP 115/67 mmHg  Pulse 63  Temp(Src) 98.6 F (37 C)  Resp 16  Ht 6\' 1"  (1.854 m)  Wt 203 lb 9.6 oz (92.352 kg)  BMI 26.87 kg/m2  SpO2 97%  GENERAL:  he is averagely built and nourished, no significant obesity HEENT:         Eye exam shows normal external appearance. Fundus exam shows no retinopathy. Oral exam shows normal mucosa .  NECK:         General:  Neck exam shows no lymphadenopathy. Carotids are normal to palpation and no bruit heard.  Thyroid is not enlarged and no nodules felt.   LUNGS:         Chest is symmetrical. Lungs are clear to auscultation.Marland Kitchen   HEART:         Heart sounds:  S1 and S2 are normal. No murmurs or clicks heard., no S3 or S4.   ABDOMEN:   There is no distention present. Liver and spleen are not palpable. No other mass or tenderness present.   NEUROLOGICAL:   Vibration sense is moderately reduced in toes, more so on the right. Ankle jerks are absent bilaterally. Diabetic foot exam shows normal monofilament sensation in the toes of the right foot, decreased on the left mildly especially the big toe, normal on the  plantar surfaces, no skin lesions, calluses or ulcers on the feet.  Pedal pulses: Right posterior tibialis normal, left posterior tibialis 1-2+ and both  anterior tibialis difficult to palpate Popliteal pulses are normal  MUSCULOSKELETAL:       There is no enlargement or deformity of the joints. Spine is normal to inspection.Marland Kitchen   SKIN:       No rash or lesions of concern.       EXTREMITIES:     There is no edema. No skin lesions present..   ASSESSMENT:  Diabetes type 2, uncontrolled with persistently high A1c despite starting insulin in 2015. See history of present illness for detailed discussion of current blood sugar patterns, management, problems identified      He is taking relatively large doses of  basal insulin and very small doses of mealtime coverage. With taking 60 units of Levemir at bedtime his fasting blood sugars are low normal; since blood sugars are higher later in the day he probably does not have a 24-hour action of Levemir insulin Most likely has significant postprandial hyperglycemia but is probably not checking readings after meals, mostly checking before eating and bedtime He has not brought his monitor for download and encouraged him to do so. Also discussed principles of adjusting pre-meal blood sugar based on meal size and carbohydrate intake rather than just pre-meal blood sugar. Although his diet appears to be fairly good he does need formal nutritional education.  He agrees to see the dietitian for this  Complications: Erectile dysfunction and has mild signs of neuropathy.  No history of retinopathy or microalbuminuria. Has mild distal peripheral vascular disease with decreased pulses History of coronary artery disease and also CVA  HYPERTENSION: Well controlled, blood pressure is also low normal  HYPERLIPIDEMIA: He has marked increase in LDL levels over 200.  Reportedly intolerant to statins. He also has history of vascular disease, previous CABG done for coronary artery disease  Mild renal dysfunction: This may be related to nephrosclerosis/vascular disease  PLAN:   Try switching Levemir to the morning and if still getting high fasting blood sugars may consider switching due to [place] or [place].  He will need to increase his suppertime Humalog by at least 2-4 units to keep his 2 hour postprandial readings at least under 180.  May also need adjustment of his other insulin doses  More blood sugar checks about 2 hours after meals and bring monitor for download on the next visit.  He will consider doing water aerobics or upper body exercises.  Consultation with the dietitian.  Consider restarting metformin to help  with insulin resistance  For his symptoms of polyuria and nocturia and he can try reducing his HCTZ to half tablet since he already has a low normal blood pressure.  He is interested in treatment for his ED and can try low-dose sildenafil  For hyperlipidemia he is a good candidate for Repatha, discussed how this works, given patient information and will verify insurance coverage.  Follow-up in 3 weeks  Patient Instructions  Change Levemir to am and reduce dose to 56 units   Increase supper time Humalog to 10-12   Please check blood sugars at least half the time about 2 hours after any meal and 3 times per week on waking up. Please bring blood sugar monitor to each visit. Recommended blood sugar levels about 2 hours after meal is 140-180 and on waking up 80-130  HCTZ 1/2 daily  Exercise as tolerated  Repatha for cholesterol   Sildenafil: use 2 tabs as needed      Counseling time over 50% of today's 60 minute visit  Hadlie Gipson 04/06/2014, 9:25 AM   Note: This office note was prepared with Estate agent. Any transcriptional errors that result from this process are unintentional.

## 2014-04-06 NOTE — Patient Instructions (Signed)
Change Levemir to am and reduce dose to 56 units   Increase supper time Humalog to 10-12   Please check blood sugars at least half the time about 2 hours after any meal and 3 times per week on waking up. Please bring blood sugar monitor to each visit. Recommended blood sugar levels about 2 hours after meal is 140-180 and on waking up 80-130  HCTZ 1/2 daily  Exercise as tolerated  Repatha for cholesterol   Sildenafil: use 2 tabs as needed

## 2014-04-09 ENCOUNTER — Ambulatory Visit: Payer: 59 | Admitting: Family Medicine

## 2014-04-12 ENCOUNTER — Ambulatory Visit (INDEPENDENT_AMBULATORY_CARE_PROVIDER_SITE_OTHER): Payer: Self-pay | Admitting: Family Medicine

## 2014-04-12 ENCOUNTER — Encounter: Payer: Self-pay | Admitting: Family Medicine

## 2014-04-12 ENCOUNTER — Other Ambulatory Visit (INDEPENDENT_AMBULATORY_CARE_PROVIDER_SITE_OTHER): Payer: 59

## 2014-04-12 VITALS — BP 118/68 | HR 65 | Ht 73.0 in | Wt 207.0 lb

## 2014-04-12 DIAGNOSIS — G5702 Lesion of sciatic nerve, left lower limb: Secondary | ICD-10-CM

## 2014-04-12 NOTE — Progress Notes (Signed)
Pre visit review using our clinic review tool, if applicable. No additional management support is needed unless otherwise documented below in the visit note. 

## 2014-04-12 NOTE — Progress Notes (Signed)
  [facility] [place], [place] [address] Phone: [phone] Subjective:     CC: left leg pain follow up  [id number] Tommy Morse is a 64 y.o. male coming in with complaint of  Left chronic buttock pain. Patient did have a steroid injection into the piriformis muscle 3 weeks ago. Patient states that he did have some improvement for a couple days and then seemed to come back. Patient continues to have the burning sensation that can even radiate to his testicles from time to time. Denies any numbness or tingling. Patient is here for PRP injection.  Reviewing patient's previous imaging 04/12/2011 CT scan of the lumbar spine showed mild L4-L5 bilateral laminotomies, posterior decompression as well as an L5-S1 right eccentric broad-based central disc protrusion contacting the descending right S1 nerve. Since this imaging patient has had multiple epidural steroid injections without any significant relief. Patient is also had a bone scan in September 2014 bone scan was essentially unremarkable.    Past medical history, social, surgical and family history all reviewed in electronic medical record.   Review of Systems: No headache, visual changes, nausea, vomiting, diarrhea, constipation, dizziness, abdominal pain, skin rash, fevers, chills, night sweats, weight loss, swollen lymph nodes, body aches, joint swelling, muscle aches, chest pain, shortness of breath, mood changes.   Objective Blood pressure 118/68, pulse 65, height 6\' 1"  (1.854 m), weight 207 lb (93.895 kg), SpO2 97 %.   Procedure note  Procedure: Real-time Ultrasound Guided Injection of ossific change within the left piriformis muscle Device: GE Logiq E  Ultrasound guided injection is preferred based studies that show increased duration, increased effect, greater accuracy, decreased procedural pain, increased response rate, and decreased cost with ultrasound guided versus blind  injection.  Verbal informed consent obtained.  Time-out conducted.  Noted no overlying erythema, induration, or other signs of local infection.  Skin prepped in a sterile fashion.  Local anesthesia: Topical Ethyl chloride.  With sterile technique and under real time ultrasound guidance:  With a 21-gauge 3 inch needle patient was injected with 2 mL of 0.5% Marcaine and 3 mL patient's centrifuged fluid that was separated earlier. This is patient's maximal tenderness. Care was taken to avoid patient's sciatic nerve. . Completed without difficulty  Pain mildly improved after injection. Patient able to ambulate without any significant trouble. Advised to call if fevers/chills, erythema, induration, drainage, or persistent bleeding.  Images permanently stored and available for review in the ultrasound unit.  Impression: Technically successful ultrasound guided injection.     Impression and Recommendations:     This case required medical decision making of moderate complexity.

## 2014-04-12 NOTE — Patient Instructions (Signed)
Good to see you Ice in 6 hours This can take about 7-14 days to work No excessive working out for next 10 days.  Then start the exercises again after that. Ok to do any regular daily activities now.  Can continue all other medicines See me again in 2 weeks.

## 2014-04-12 NOTE — Assessment & Plan Note (Signed)
Patient was given a PRP injection today. Patient tolerated the procedure very well. Patient did have mild improvement. Discussed with patient about the progression of decreasing activity for now and then slowly increasing over the course of the next 2 weeks. We discussed the icing regimen. Patient come back and see me again in 2 weeks for further evaluation and treatment.

## 2014-04-26 ENCOUNTER — Encounter: Payer: Self-pay | Admitting: Family Medicine

## 2014-04-26 ENCOUNTER — Ambulatory Visit (INDEPENDENT_AMBULATORY_CARE_PROVIDER_SITE_OTHER): Payer: 59 | Admitting: Family Medicine

## 2014-04-26 VITALS — BP 130/80 | HR 70 | Ht 73.0 in | Wt 206.0 lb

## 2014-04-26 DIAGNOSIS — M5432 Sciatica, left side: Secondary | ICD-10-CM

## 2014-04-26 NOTE — Progress Notes (Signed)
  [facility] [place],  [address] Phone: [phone] Subjective:     CC: left leg pain follow up  [id number] Tommy Morse is a 64 y.o. male coming in with complaint of  Left chronic buttock pain. Patient has had a long-standing pain with this. Patient has had multiple procedures done in 3 weeks ago patient was given a PRP injection. Patient states he did not have any significant improvement and continues to have the same discomfort. Patient is wondering if any surgical intervention is possible.  Reviewing patient's previous imaging 04/12/2011 CT scan of the lumbar spine showed mild L4-L5 bilateral laminotomies, posterior decompression as well as an L5-S1 right eccentric broad-based central disc protrusion contacting the descending right S1 nerve. Since this imaging patient has had multiple epidural steroid injections without any significant relief. Patient is also had a bone scan in September 2014 bone scan was essentially unremarkable.    Past medical history, social, surgical and family history all reviewed in electronic medical record.   Review of Systems: No headache, visual changes, nausea, vomiting, diarrhea, constipation, dizziness, abdominal pain, skin rash, fevers, chills, night sweats, weight loss, swollen lymph nodes, body aches, joint swelling, muscle aches, chest pain, shortness of breath, mood changes.   Objective Blood pressure 130/80, pulse 70, height 6\' 1"  (1.854 m), weight 206 lb (93.441 kg), SpO2 98 %.   Hip: Left ROM IR: 25 Deg, ER: 25 Deg severe tightness compared to the contralateral side, Flexion: 120 Deg, Extension: 100 Deg, Abduction: 45 Deg, Adduction: 45 Deg Strength IR: 4/5, ER: 4/5, Flexion: 5/5, Extension: 5/5, Abduction: 4/5, Adduction: 5/5 Pelvic alignment unremarkable to inspection and palpation. Standing hip rotation and gait without trendelenburg sign / unsteadiness. Greater trochanter without  tenderness to palpation. No tenderness over piriformis and greater trochanter. Positive Faber with severe decrease in range of motion. Tenderness over the left SI joint Contralateral hip unremarkable No change from previous exam    Impression and Recommendations:     This case required medical decision making of moderate complexity.

## 2014-04-26 NOTE — Assessment & Plan Note (Signed)
Patient continues to have the chronic sciatica on the left side. Patient did have an appeared to be a calcific change next to patient's sciatic nerve which has not responded to steroid injection or unfortunately to a PRP injection. Patient is looking forward to stem cell therapy. Patient already has lead on an individual. Discussed with patient about other possible tests such as an MR neurography is the only other potential thing I can offer him but likely would not be covered by insurance. Discussed referral to neurosurgery for further evaluation but patient states he has had this done. Differential still includes kind of a post stroke chronic pain but it does seem to be more isolated to the left lower extremity. Patient has been on many different medications over the course of time. Patient come back and see me again on an as-needed basis if there is anything I can do for him. Discussed for quite some time today.

## 2014-04-26 NOTE — Progress Notes (Signed)
Pre visit review using our clinic review tool, if applicable. No additional management support is needed unless otherwise documented below in the visit note. 

## 2014-04-26 NOTE — Patient Instructions (Signed)
Verbal instructions given

## 2014-04-27 ENCOUNTER — Encounter: Payer: Self-pay | Admitting: Endocrinology

## 2014-04-27 ENCOUNTER — Ambulatory Visit (INDEPENDENT_AMBULATORY_CARE_PROVIDER_SITE_OTHER): Payer: 59 | Admitting: Endocrinology

## 2014-04-27 VITALS — BP 116/61 | HR 61 | Temp 98.3°F | Resp 14 | Ht 73.0 in | Wt 203.2 lb

## 2014-04-27 DIAGNOSIS — IMO0002 Reserved for concepts with insufficient information to code with codable children: Secondary | ICD-10-CM

## 2014-04-27 DIAGNOSIS — E1165 Type 2 diabetes mellitus with hyperglycemia: Secondary | ICD-10-CM

## 2014-04-27 DIAGNOSIS — E785 Hyperlipidemia, unspecified: Secondary | ICD-10-CM

## 2014-04-27 MED ORDER — EZETIMIBE 10 MG PO TABS: 10.0000 mg | ORAL_TABLET | Freq: Every day | ORAL | Status: DC

## 2014-04-27 NOTE — Patient Instructions (Addendum)
Please check blood sugars at least half the time about 2 hours after any meal and 3 times per week on waking up. Please bring blood sugar monitor to each visit. Recommended blood sugar levels about 2 hours after meal is 140-180 and on waking up 90-130  Humalog 5-6 at breakfast and 8-10 at lunch and supper based on meal size  Levemir 58 daily and may use 2-4 more units if am sugar stays > 140 in am

## 2014-04-27 NOTE — Progress Notes (Signed)
Patient ID: Tommy Morse, male   DOB: February 11, 1951, 64 y.o.   MRN: [id number]           Reason for Appointment: follow-up for Type 2 Diabetes  Referring physician:  History of Present Illness:          Diagnosis: Type 2 diabetes mellitus, date of diagnosis: ?  2011        Past history: He is not sure about when he was diagnosed to have diabetes. He thinks he was started on glipizide and metformin at the time of diagnosis.  He had not been started on glucose monitoring for a while Also has not had any diabetes education in the past. Apparently because of significantly high A1c in 07/2013 he was started on Levemir insulin which had been increased progressively over the next few months.  His metformin and glipizide was stopped.   Because of inadequate response and persistent high readings he was also given Humalog low doses in 10/2013  Recent history: Since his A1c in 1/16 was 9.1 he was referred here for further management. Because of his fasting readings being low normal and blood sugars being higher later in the day especially postprandially he was asked to switch to Levemir to the morning and also increase his suppertime dose He has however arbitrarily reduced his Humalog only to 5 units instead of taking as much as 10-12 units. Also he did not understand the need to take both Humalog and Levemir at breakfast and until a week ago was not taking any Humalog in the morning causing postprandial hyperglycemia after breakfast Also with taking the Levemir in the morning his fasting readings are relatively high at times; also he is taking only 56 units compared to 60 units previously He still has not been able to exercise, has exercise in the pool only once He has not been seen by a nutritionist as yet Hypoglycemia: twice after taking extra insulin for this. Hyperglycemia after breakfast       Oral hypoglycemic drugs the patient is taking are: None      INSULIN regimen is described as:   Levemir 56 units in am at bedtime.  Humalog 5-6 units at breakfast, 10 units at lunch and 7-9 units at supper   Compliance with the medical regimen: Good   Glucose monitoring:  done 1-2 time a day         Glucometer: One Touch.      Blood Glucose readings by time of day by recall  PRE-MEAL Breakfast Lunch Dinner Bedtime Overall  Glucose range: 94-181  96-192    median: 140    157   POST-MEAL PC Breakfast PC Lunch PC Dinner  Glucose range: 143-224 116-176 166-242  median:   220     Self-care: The diet that the patient has been following is: tries to limit carbohydrates and fats .     Meals: 3 meals per day. Breakfast is an egg or yogurt with a carbohydrate like dosed/oatmeal/ muffin.  Lunch is a sandwich.  Dinner is chicken or meat with sweet potato and vegetables.  May have a few pretzels or Cheetos for snacks           Exercise: minimal, limited by leg pain,some water exercise          Dietician visit, most recent: Never but is interested in seeing one .               Weight history: Previous range 200-210  Wt Readings  from Last 3 Encounters:  04/27/14 203 lb 3.2 oz (92.171 kg)  04/26/14 206 lb (93.441 kg)  04/12/14 207 lb (93.895 kg)    Glycemic control:   Lab Results  Component Value Date   HGBA1C 9.1* 03/23/2014   HGBA1C 10.8* 11/20/2013   HGBA1C 10.2* 07/28/2013   Lab Results  Component Value Date   MICROALBUR 2.5* 07/28/2013   LDLCALC 133* 12/11/2012   CREATININE 1.40 03/23/2014         Medication List       This list is accurate as of: 04/27/14  9:42 PM.  Always use your most recent med list.               clopidogrel 75 MG tablet  Commonly known as:  PLAVIX  TAKE 1 TABLET (75 MG TOTAL) BY MOUTH DAILY.     erythromycin ophthalmic ointment  Commonly known as:  ROMYCIN  Place 1 application into the right eye 3 (three) times daily.     ezetimibe 10 MG tablet  Commonly known as:  ZETIA  Take 1 tablet (10 mg total) by mouth daily.     gemfibrozil  600 MG tablet  Commonly known as:  LOPID  TAKE 1 TABLET (600 MG TOTAL) BY MOUTH 2 (TWO) TIMES DAILY BEFORE A MEAL.     glucose blood test strip  Commonly known as:  ONE TOUCH ULTRA TEST  TEST TWICE A DAY     hydrochlorothiazide 25 MG tablet  Commonly known as:  HYDRODIURIL  TAKE 1 TABLET BY MOUTH DAILY     Insulin Detemir 100 UNIT/ML Pen  Commonly known as:  LEVEMIR FLEXTOUCH  60 U SQ qhs     insulin lispro 100 UNIT/ML KiwkPen  Commonly known as:  HUMALOG  10 U qAC     Insulin Pen Needle 32G X 6 MM Misc  Commonly known as:  NOVOFINE  Use with insulin pen (one at bedtime and one at each meal= 4 per day)     metoprolol 50 MG tablet  Commonly known as:  LOPRESSOR  TAKE 1 AND 1/2 TABLETS BY MOUTH TWICE A DAY     multivitamin capsule  Take 1 capsule by mouth daily.     pantoprazole 40 MG tablet  Commonly known as:  PROTONIX  Take 1 tablet (40 mg total) by mouth 2 (two) times daily.     ramipril 10 MG capsule  Commonly known as:  ALTACE  TAKE 1 CAPSULE (10 MG TOTAL) BY MOUTH DAILY.     sildenafil 20 MG tablet  Commonly known as:  REVATIO  Use 2 tablets at a time as directed     vitamin B-12 100 MCG tablet  Commonly known as:  CYANOCOBALAMIN  Take 50 mcg by mouth daily.        Allergies:  Allergies  Allergen Reactions  . Amiodarone Other (See Comments)    "Made me Dr. Jack Quarto and Tommy Morse."  . Amoxicillin-Pot Clavulanate Rash  . Statins Other (See Comments)    "Puts me in a funk."    Past Medical History  Diagnosis Date  . Hypertension   . Hyperlipemia, mixed     intolerance to statins  . LVH (left ventricular hypertrophy)   . Hypertriglyceridemia     Triglycerides 741 March '13 - Initiated on Gemfibrozil  . Diabetes mellitus     A1C 7.2 Aug '13, microalbumin slightly elevated  . Coronary artery disease     CABG - x6 - 2008, - Lexiscan myoview  05/14/11 no inducible ischemia, EF 68%  . 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 Left sided sciatica 2012    neuropathic meds no help/side effects.  Has seen multiple specialists.  EMG neg.  Dr. 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  . Left hip pain 2012    Dr. Ramos--injections no help---long record of specialists ([facility] did CT lumbar myelogram and EMG/NCS.  Neurology did MRI L spine), still no final explanation and spinal stimulator may be ultimate treatment  . BPH (benign prostatic hypertrophy)   . History of balanitis     pt uncircumcised  . GERD (gastroesophageal reflux disease)   . History of adenomatous polyp of colon 03/2013    Recall 5 yrs  . Hypogonadism male     Testost implants --testopel-- 05/2013 (Dr. Ivory Broad [phone])  . Chronic renal insufficiency, stage III (moderate) 11/20/2013    CrCl 50s    Past Surgical History  Procedure Laterality Date  . Cardiac catheterization  04/19/06    EF 60-65%  . 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 Laminectomy  06/2009    L3-L4  . Back surgery  2013  . Prostate surgery      ?TURP--need old records  . Esophagogastroduodenoscopy (egd) with esophageal dilation  01/2013    EGD normal (no stricture) but dilation done for pt's dysphagia  . Colonoscopy w/ polypectomy  03/2013    Tubular adenoma; repeat 5 yrs  . Carotid u/s  12/2013    Stable 1-39% bilateral ICA stenosis.  O/w unremarkable.  Recommended repeat in 2 yrs (12/2015).  . Transthoracic echocardiogram  2008    Mild LVH, normal systolic fxn/normal wall motion, +diastolic dysfxn.    Family History  Problem Relation Age of Onset  . Heart disease Mother     valvular heart disease / Strong Family History  . Heart attack Mother   . Heart attack Father   . Hypertension Brother   . Colon cancer Neg Hx   . Esophageal cancer Neg Hx   . Stomach cancer Neg Hx   . Rectal cancer Neg Hx      Social History:  reports that he has never smoked. He has never used smokeless tobacco. He reports that he does not drink alcohol or use illicit drugs.    Review of Systems       Most recent eye exam was in 2015       Lipids:  Currently taking only Gemfibrozil.  He thinks he has aching of joints and muscles with statin drugs including Livalo Had taken Zetia several years ago but no record of his prescription is available His insurance has denied Repatha and not clear what their criteria are for approval but they do want 12 week treatment with Zetia and May possibly cover Praluent Is not on any specific treatment for high LDL       Lab Results  Component Value Date   CHOL 344* 11/20/2013   HDL 33.40* 11/20/2013   LDLCALC 133* 12/11/2012   LDLDIRECT 242.6 11/20/2013   TRIG 283.0* 11/20/2013   CHOLHDL 10 11/20/2013                  The blood pressure has been controlled with HCTZ 12.5 mg, ramipril and metoprolol. His HCTZ was reduced to half a tablet  reduced polyuria which he has done  Diabetic foot exam in 2/16 shows normal monofilament sensation in the toes of the right foot, decreased on the left mildly especially the big toe, normal on the  plantar surfaces, no skin lesions, calluses or ulcers on the feet.  Pedal pulses: Right posterior tibialis normal, left posterior tibialis 1-2+ and both  anterior tibialis difficult to palpate  LABS:  No visits with results within 1 Week(s) from this visit. Latest known visit with results is:  Office Visit on 03/23/2014  Component Date Value Ref Range Status  . Hgb A1c MFr Bld 03/23/2014 9.1* 4.6 - 6.5 % Final   Glycemic Control Guidelines for People with Diabetes:Non Diabetic:  <6%Goal of Therapy: <7%Additional Action Suggested:  >8%   . Sodium 03/23/2014 135  135 - 145 mEq/L Final  . Potassium 03/23/2014 4.4  3.5 - 5.1 mEq/L Final  . Chloride 03/23/2014 97  96 - 112 mEq/L Final  . CO2 03/23/2014 29  19 - 32 mEq/L Final  . Glucose,  Bld 03/23/2014 142* 70 - 99 mg/dL Final  . BUN 03/23/2014 26* 6 - 23 mg/dL Final  . Creatinine, Ser 03/23/2014 1.40  0.40 - 1.50 mg/dL Final  . Calcium 03/23/2014 10.1  8.4 - 10.5 mg/dL Final  . GFR 03/23/2014 54.12* >60.00 mL/min Final    Physical Examination:  BP 116/61 mmHg  Pulse 61  Temp(Src) 98.3 F (36.8 C)  Resp 14  Ht 6\' 1"  (1.854 m)  Wt 203 lb 3.2 oz (92.171 kg)  BMI 26.81 kg/m2  SpO2 97%    ASSESSMENT:  Diabetes type 2, uncontrolled with persistently high A1c despite starting insulin in 2015. See history of present illness for detailed discussion of current blood sugar patterns, management, problems identified   He has not followed instructions for his insulin regimen especially mealtime and appears to need considerable education regarding how to dose insulin and actions of both basal and bolus insulin as well as how to adjust the dose to get targets under control especially postprandial He may be getting unbalanced meals also at times especially breakfast  HYPERTENSION: Well controlled, blood pressure is again low normal  HYPERLIPIDEMIA: He has marked increase in LDL levels over 200 and is intolerant to statins.He has known CAD   PLAN:   Start increasing Levemir insulin and try to target fasting readings to under 140.  He will increase it every 3 days.  If not getting good control will consider Toujeo  Start increasing mealtime insulin especially at lunch and supper and probably needs the highest dose at suppertime  Discussed needing to check 2 hour readings consistently in blood sugar targets to be expected.  Start keeping a detailed record of his food eaten and pre-and post prandial readings.  He will take this record to the diabetes educator, worksheet given  He will try Zetia to meet the insurance company requirements before starting another prior authorization for Praluent  Patient Instructions  Please check blood sugars at least half the time about 2  hours after any meal and 3 times per week on waking up. Please bring blood sugar monitor to each visit. Recommended blood sugar levels about 2 hours after meal is 140-180 and on waking up 90-130  Humalog 5-6 at breakfast and 8-10 at lunch and supper based on meal size  Levemir 58 daily and may use 2-4 more units if am sugar stays > 140 in am   Counseling time regarding detailed diabetes management especially regarding various  aspects of insulin therapy over 50% of today's 25 minute visit  Tommy Morse 04/27/2014, 9:42 PM   Note: This office note was prepared with Estate agent. Any transcriptional errors that result from this process are unintentional.

## 2014-04-28 ENCOUNTER — Encounter: Payer: Self-pay | Admitting: Family Medicine

## 2014-05-03 ENCOUNTER — Telehealth: Payer: Self-pay | Admitting: *Deleted

## 2014-05-03 NOTE — Telephone Encounter (Signed)
Patient would like to speak with Dr. Carles Collet but would not say in reference to what C/b [phone]

## 2014-05-03 NOTE — Telephone Encounter (Signed)
I called patient back, but he did not want to speak with me either. He did not want to make an appt. Aware that I could pass a message a long to Dr Tat for him but he was not interested. He states as he was getting off the phone it was about a referral. He will call back if needed. FYI Dr Tat.

## 2014-05-03 NOTE — Telephone Encounter (Signed)
Mychart message sent to patient.

## 2014-05-03 NOTE — Telephone Encounter (Signed)
I see he is active on my chart.  Ask him to send me a message.

## 2014-05-10 ENCOUNTER — Other Ambulatory Visit: Payer: Self-pay | Admitting: *Deleted

## 2014-05-10 ENCOUNTER — Telehealth: Payer: Self-pay | Admitting: Endocrinology

## 2014-05-10 MED ORDER — INSULIN DETEMIR 100 UNIT/ML FLEXPEN: PEN_INJECTOR | SUBCUTANEOUS | Status: DC

## 2014-05-10 NOTE — Telephone Encounter (Signed)
rx sent

## 2014-05-10 NOTE — Telephone Encounter (Signed)
Please call in levemir 58 u each AM

## 2014-05-11 ENCOUNTER — Encounter: Payer: 59 | Admitting: Nutrition

## 2014-05-11 ENCOUNTER — Ambulatory Visit (INDEPENDENT_AMBULATORY_CARE_PROVIDER_SITE_OTHER): Payer: 59 | Admitting: Family Medicine

## 2014-05-11 ENCOUNTER — Encounter: Payer: Self-pay | Admitting: Family Medicine

## 2014-05-11 VITALS — BP 109/75 | HR 60 | Temp 98.2°F | Resp 18 | Ht 72.0 in | Wt 200.0 lb

## 2014-05-11 DIAGNOSIS — M1000 Idiopathic gout, unspecified site: Secondary | ICD-10-CM

## 2014-05-11 DIAGNOSIS — M109 Gout, unspecified: Secondary | ICD-10-CM

## 2014-05-11 DIAGNOSIS — I10 Essential (primary) hypertension: Secondary | ICD-10-CM

## 2014-05-11 LAB — BASIC METABOLIC PANEL
BUN: 25 mg/dL — ABNORMAL HIGH (ref 6–23)
CO2: 36 meq/L — AB (ref 19–32)
Calcium: 10.3 mg/dL (ref 8.4–10.5)
Chloride: 97 mEq/L (ref 96–112)
Creatinine, Ser: 1.36 mg/dL (ref 0.40–1.50)
GFR: 55.94 mL/min — AB (ref >60.00)
GLUCOSE: 85 mg/dL (ref 70–99)
POTASSIUM: 4.1 meq/L (ref 3.5–5.1)
SODIUM: 137 meq/L (ref 135–145)

## 2014-05-11 LAB — URIC ACID: Uric Acid, Serum: 6.8 mg/dL (ref 4.0–7.8)

## 2014-05-11 MED ORDER — COLCHICINE 0.6 MG PO TABS: ORAL_TABLET | ORAL | Status: DC

## 2014-05-11 MED ORDER — PREDNISONE 20 MG PO TABS: ORAL_TABLET | ORAL | Status: DC

## 2014-05-11 NOTE — Progress Notes (Signed)
Pre visit review using our clinic review tool, if applicable. No additional management support is needed unless otherwise documented below in the visit note. 

## 2014-05-11 NOTE — Progress Notes (Signed)
OFFICE NOTE  05/11/2014  CC:  Chief Complaint  Patient presents with  . Foot Pain    woke up with pain   HPI: Patient is a 64 y.o. Caucasian male who is here for foot pain.   Woke up in middle of night with right great toe pain, no hx of recent injury. Points to R MTP joint, pinpoint tenderness.  No fever/chills/malaise.  Has never had this before.  No recent change in diet/unusual foods, no recent new meds.  Pertinent PMH:  Past medical, surgical, social, and family history reviewed and no changes are noted since last office visit.  MEDS:  Outpatient Prescriptions Prior to Visit  Medication Sig Dispense Refill  . clopidogrel (PLAVIX) 75 MG tablet TAKE 1 TABLET (75 MG TOTAL) BY MOUTH DAILY. 90 tablet 1  . gemfibrozil (LOPID) 600 MG tablet TAKE 1 TABLET (600 MG TOTAL) BY MOUTH 2 (TWO) TIMES DAILY BEFORE A MEAL. 60 tablet 1  . hydrochlorothiazide (HYDRODIURIL) 25 MG tablet TAKE 1 TABLET BY MOUTH DAILY 30 tablet 1  . Insulin Detemir (LEVEMIR FLEXTOUCH) 100 UNIT/ML Pen 60 U SQ qhs 30 mL 3  . insulin lispro (HUMALOG) 100 UNIT/ML KiwkPen 10 U qAC (Patient taking differently: 5-10 Units. 5-10 units before meals) 15 mL 6  . metoprolol (LOPRESSOR) 50 MG tablet TAKE 1 AND 1/2 TABLETS BY MOUTH TWICE A DAY 270 tablet 1  . Multiple Vitamin (MULTIVITAMIN) capsule Take 1 capsule by mouth daily.    . pantoprazole (PROTONIX) 40 MG tablet Take 1 tablet (40 mg total) by mouth 2 (two) times daily. 180 tablet 1  . ramipril (ALTACE) 10 MG capsule TAKE 1 CAPSULE (10 MG TOTAL) BY MOUTH DAILY. 30 capsule 1  . vitamin B-12 (CYANOCOBALAMIN) 100 MCG tablet Take 50 mcg by mouth daily.    Marland Kitchen erythromycin [facility]) ophthalmic ointment Place 1 application into the right eye 3 (three) times daily. (Patient not taking: Reported on 04/27/2014) 3.5 g 0  . ezetimibe (ZETIA) 10 MG tablet Take 1 tablet (10 mg total) by mouth daily. (Patient not taking: Reported on 05/11/2014) 30 tablet 3  . glucose blood (ONE TOUCH ULTRA  TEST) test strip TEST TWICE A DAY 50 each 3  . Insulin Pen Needle (NOVOFINE) 32G X 6 MM MISC Use with insulin pen (one at bedtime and one at each meal= 4 per day) 100 each 12  . sildenafil (REVATIO) 20 MG tablet Use 2 tablets at a time as directed 20 tablet 2   No facility-administered medications prior to visit.    PE: Blood pressure 109/75, pulse 60, temperature 98.2 F (36.8 C), temperature source Temporal, resp. rate 18, height 6' (1.829 m), weight 200 lb (90.719 kg), SpO2 98 %. Gen: Alert, well appearing.  Patient is oriented to person, place, time, and situation. CV: RRR, no m/r/g.   LUNGS: CTA bilat, nonlabored resps, good aeration in all lung fields. Right great toe with focal erythema and exquisite tenderness to palpation on the MTP joint.  No swelling.  Mild warmth. ROM of ankle, foot, toes intact.    IMPRESSION AND PLAN:  Podagra, right foot.  First episode of gouty arthritis. Discussed checking uric acid today and starting xanthine oxidase inhibitor if it comes back significantly elevated. If not signif elevated uric acid at this time, then will wait to see if he gets recurrent gout before starting xanthine oxidase inhibitor. For acute flare I rx'd colchicine 0.6mg , 2 tabs now, then 1 tab in 1 hr, then starting tomorrow take 1  tab bid until flare is resolved.  Pt is going out of town tomorrow morning, so I did print him off a rx for prednisone to take if not improved with 2d of the colchicine therapy. Low purine diet handout given to patient today.  An After Visit Summary was printed and given to the patient.  FOLLOW UP: prn

## 2014-05-17 ENCOUNTER — Other Ambulatory Visit: Payer: Self-pay | Admitting: *Deleted

## 2014-05-17 DIAGNOSIS — M25552 Pain in left hip: Secondary | ICD-10-CM

## 2014-05-18 ENCOUNTER — Encounter: Payer: 59 | Attending: Endocrinology | Admitting: Nutrition

## 2014-05-18 DIAGNOSIS — Z713 Dietary counseling and surveillance: Secondary | ICD-10-CM | POA: Insufficient documentation

## 2014-05-18 DIAGNOSIS — E119 Type 2 diabetes mellitus without complications: Secondary | ICD-10-CM | POA: Insufficient documentation

## 2014-05-18 DIAGNOSIS — Z794 Long term (current) use of insulin: Secondary | ICD-10-CM | POA: Diagnosis not present

## 2014-05-18 NOTE — Patient Instructions (Signed)
Test blood sugars 2hr. After finishing the meal. Reduce meat portions at supper to 4-6 ounces.

## 2014-05-18 NOTE — Progress Notes (Signed)
Pt. Is here to review readings and insulin adjustments. He did not bring his meter.  He says that the FBSs are around 130, and 1hr. PcB: 187,  1-2 hours pcL: 160-170,1- 2hr. PcS: 175-180.  Diet:  Meals are balanced and low in fat.  They contain 1-2 servings of carbohydrates.  Meat portion at supper is 6-8 ounces.  Does not drink sweet drinks, or juices.  HS snack will vary in the amounts of carbs--but usually no more than 30 grams. Activity:  Hip and knee problems.  Was going to the gym and doing water exercises, but has not done it lately. Insulin:  AM: 58u Levemir,  Humalog: 6/5-6/10.   Low blood sugars:  Only one, when he delayed lunch after taking Humalog  Suggested changes:  1. Reduce meat portions to 4-6 ounces at supper meal. 2.  He requested list of servings of carbs, and was given one.  We discussed this.  He was also given a handout on balalncing meals with breakfast choices, reading labels, and protein choices for meals. 3.  He was also given a list of appropriate 15 gram treatments for low blood sugar.  We reviewed this, and he reported good understanding. 4.  Wait and test blood sugars until 2 hours after the meal.

## 2014-05-22 ENCOUNTER — Ambulatory Visit
Admission: RE | Admit: 2014-05-22 | Discharge: 2014-05-22 | Disposition: A | Discharge status: Home / Self Care | Payer: 59 | Source: Ambulatory Visit | Attending: *Deleted | Admitting: *Deleted

## 2014-05-22 DIAGNOSIS — M25552 Pain in left hip: Secondary | ICD-10-CM

## 2014-05-24 ENCOUNTER — Telehealth: Payer: Self-pay | Admitting: Family Medicine

## 2014-05-24 MED ORDER — COLCHICINE 0.6 MG PO CAPS: ORAL_CAPSULE | ORAL | Status: DC

## 2014-05-24 NOTE — Telephone Encounter (Signed)
Pt aware Rx is ready for p/u.

## 2014-05-24 NOTE — Telephone Encounter (Signed)
Pt's insurance doesn't want to pay for colchicine.  Pharmacy told him that mitigare is an alternative that his insurance would cover.  Patient would like RX printed.  Please advise.

## 2014-05-24 NOTE — Telephone Encounter (Signed)
Mitigare rx (brand name for colchicine) printed per pt's request.

## 2014-05-26 ENCOUNTER — Ambulatory Visit: Payer: 59 | Admitting: Endocrinology

## 2014-06-07 ENCOUNTER — Telehealth: Payer: Self-pay | Admitting: Family Medicine

## 2014-06-07 NOTE — Telephone Encounter (Signed)
Please advise?  I only see colchicine in his med list for gout.

## 2014-06-07 NOTE — Telephone Encounter (Signed)
He only needs to take his colchicine if he gets a flare up of his gout in a joint. He does not need to be on a daily maintenance med for prevention of gout flares at this time.

## 2014-06-07 NOTE — Telephone Encounter (Signed)
Patient was at the pharmacy & they asked him if he needed to refill his "gout medication". Patient wasn't sure if it was a maintanance medication that he should keep taking or not. Please call him.

## 2014-06-08 NOTE — Telephone Encounter (Signed)
Pt is aware.  

## 2014-06-26 ENCOUNTER — Other Ambulatory Visit: Payer: Self-pay | Admitting: Internal Medicine

## 2014-06-30 ENCOUNTER — Other Ambulatory Visit: Payer: Self-pay | Admitting: *Deleted

## 2014-06-30 NOTE — Telephone Encounter (Signed)
Fax from [place] requesting refill for One Touch Ultra Test Strips. Rx faxed manually to [place] for #120 w/ 11RF sig: check BS 4 x daily per Dr. Anitra Lauth request.

## 2014-08-04 ENCOUNTER — Other Ambulatory Visit: Payer: Self-pay | Admitting: Internal Medicine

## 2014-08-26 ENCOUNTER — Ambulatory Visit (INDEPENDENT_AMBULATORY_CARE_PROVIDER_SITE_OTHER): Payer: Medicare Other | Admitting: Endocrinology

## 2014-08-26 ENCOUNTER — Encounter: Payer: Self-pay | Admitting: Endocrinology

## 2014-08-26 VITALS — BP 110/62 | HR 57 | Temp 98.0°F | Resp 14 | Ht 72.0 in | Wt 202.2 lb

## 2014-08-26 DIAGNOSIS — E1165 Type 2 diabetes mellitus with hyperglycemia: Secondary | ICD-10-CM

## 2014-08-26 DIAGNOSIS — IMO0002 Reserved for concepts with insufficient information to code with codable children: Secondary | ICD-10-CM

## 2014-08-26 DIAGNOSIS — E785 Hyperlipidemia, unspecified: Secondary | ICD-10-CM

## 2014-08-26 LAB — POCT URINALYSIS DIPSTICK
BILIRUBIN UA: NEGATIVE
Blood, UA: NEGATIVE
Glucose, UA: NEGATIVE
KETONES UA: NEGATIVE
Leukocytes, UA: NEGATIVE
Nitrite, UA: NEGATIVE
Spec Grav, UA: 1.015
Urobilinogen, UA: 0.2
pH, UA: 6

## 2014-08-26 LAB — HEMOGLOBIN A1C: Hgb A1c MFr Bld: 7.6 % — ABNORMAL HIGH (ref 4.6–6.5)

## 2014-08-26 LAB — COMPREHENSIVE METABOLIC PANEL
ALK PHOS: 63 U/L (ref 39–117)
ALT: 15 U/L (ref 0–53)
AST: 22 U/L (ref 0–37)
Albumin: 4.5 g/dL (ref 3.5–5.2)
BUN: 31 mg/dL — ABNORMAL HIGH (ref 6–23)
CALCIUM: 10 mg/dL (ref 8.4–10.5)
CHLORIDE: 100 meq/L (ref 96–112)
CO2: 29 mEq/L (ref 19–32)
Creatinine, Ser: 1.35 mg/dL (ref 0.40–1.50)
GFR: 56.37 mL/min — ABNORMAL LOW (ref >60.00)
Glucose, Bld: 144 mg/dL — ABNORMAL HIGH (ref 70–99)
Potassium: 4 mEq/L (ref 3.5–5.1)
SODIUM: 137 meq/L (ref 135–145)
TOTAL PROTEIN: 8.1 g/dL (ref 6.0–8.3)
Total Bilirubin: 0.6 mg/dL (ref 0.2–1.2)

## 2014-08-26 LAB — LIPID PANEL
Cholesterol: 305 mg/dL — ABNORMAL HIGH (ref 0–200)
HDL: 36.6 mg/dL — ABNORMAL LOW (ref >39.00)
NonHDL: 268.4
TRIGLYCERIDES: 222 mg/dL — AB (ref 0.0–149.0)
Total CHOL/HDL Ratio: 8
VLDL: 44.4 mg/dL — AB (ref 0.0–40.0)

## 2014-08-26 LAB — MICROALBUMIN / CREATININE URINE RATIO
Creatinine,U: 129.2 mg/dL
MICROALB/CREAT RATIO: 0.5 mg/g (ref 0.0–30.0)

## 2014-08-26 LAB — LDL CHOLESTEROL, DIRECT: LDL DIRECT: 211 mg/dL

## 2014-08-26 MED ORDER — INSULIN GLARGINE 300 UNIT/ML ~~LOC~~ SOPN: 60.0000 [IU] | PEN_INJECTOR | Freq: Every day | SUBCUTANEOUS | Status: DC

## 2014-08-26 NOTE — Progress Notes (Signed)
Patient ID: Tommy Morse, male   DOB: Mar 28, 1950, 64 y.o.   MRN: [id number]           Reason for Appointment: follow-up for Type 2 Diabetes  Referring physician:  History of Present Illness:          Diagnosis: Type 2 diabetes mellitus, date of diagnosis: ?  2011        Past history: He is not sure about when he was diagnosed to have diabetes. He thinks he was started on glipizide and metformin at the time of diagnosis.  He had not been started on glucose monitoring for a while Also has not had any diabetes education in the past. Apparently because of significantly high A1c in 07/2013 he was started on Levemir insulin which had been increased progressively over the next few months.  His metformin and glipizide was stopped.   Because of inadequate response and persistent high readings he was also given Humalog low doses in 10/2013  Recent history: Since his A1c in 1/16 was 9.1 he was referred here for further management. He was told to take Levemir in the morning and increase his Humalog to control postprandial hyperglycemia He has only increased his supper time Humalog and not taking much at breakfast and lunch, does not appear to understand that increased to target his postprandial readings with mealtime insulin  Current blood sugar patterns and problems identified:  He is checking blood sugars primarily fasting and not any after meals  Difficult to interpret his blood sugar download as his monitor is 4 hours behind and he does not label his postprandial readings  He has not increased his Levemir except by 2 units even though fasting readings are high  POSTPRANDIAL readings after supper are infrequent and he has 2 high readings  He had significantly high readings after lunch several times but again these are not consistent  Also blood sugars after breakfast and appear to be actively higher  No recent A1c available  He still has not been able to exercise He has seen the  diabetes educator and overall has properly been fairly good with his diet Hypoglycemia: Only last Sunday after supper possibly from eating less. Hyperglycemia after breakfast       Oral hypoglycemic drugs the patient is taking are: None      INSULIN regimen is described as:  Levemir 58 units in am.  Humalog 5-6 units at breakfast,  at lunch and 15 units at supper   Compliance with the medical regimen: Good   Glucose monitoring:  done 1-2 time a day         Glucometer: One Touch.      Blood Glucose readings by time of day   Mean values apply above for all meters except median for One Touch  PRE-MEAL Fasting Lunch Dinner Bedtime Overall  Glucose range:  112-267    73-261  57-233   Mean/median: 165    176   POST-MEAL PC Breakfast PC Lunch PC Dinner  Glucose range:  160-205     Mean/median:      Self-care: The diet that the patient has been following is: tries to limit carbohydrates and fats .     Meals: 3 meals per day. Breakfast is an egg or yogurt with a carbohydrate like dosed/oatmeal/ muffin.  Lunch is a sandwich.  Dinner is chicken or meat with sweet potato and vegetables.  May have a few pretzels or Cheetos for snacks  Exercise: minimal, limited by leg pain          Dietician visit, most recent: None, has seen CDE  Weight history: Previous range 200-210  Wt Readings from Last 3 Encounters:  08/26/14 202 lb 3.2 oz (91.717 kg)  05/11/14 200 lb (90.719 kg)  04/27/14 203 lb 3.2 oz (92.171 kg)    Glycemic control:   Lab Results  Component Value Date   HGBA1C 9.1* 03/23/2014   HGBA1C 10.8* 11/20/2013   HGBA1C 10.2* 07/28/2013   Lab Results  Component Value Date   MICROALBUR <0.7 08/26/2014   LDLCALC 133* 12/11/2012   CREATININE 1.36 05/11/2014         Medication List       This list is accurate as of: 08/26/14  2:44 PM.  Always use your most recent med list.               BD PEN NEEDLE NANO U/F 32G X 4 MM Misc  Generic drug:  Insulin Pen Needle    USE WITH INSULIN PEN (ONE AT BEDTIME AND ONE AT [facility] MEAL) 4 PER DAY     clopidogrel 75 MG tablet  Commonly known as:  PLAVIX  TAKE 1 TABLET (75 MG TOTAL) BY MOUTH DAILY.INS ALLOWS 30 DAYS     Colchicine 0.6 MG Caps  Commonly known as:  MITIGARE  Take 2 tabs at onset of gout flare, then 1 tab an hour later.  Then start 1 tab po bid the next day and take until gout flare has completely resolved.     erythromycin ophthalmic ointment  Commonly known as:  ROMYCIN  Place 1 application into the right eye 3 (three) times daily.     ezetimibe 10 MG tablet  Commonly known as:  ZETIA  Take 1 tablet (10 mg total) by mouth daily.     gemfibrozil 600 MG tablet  Commonly known as:  LOPID  TAKE 1 TABLET (600 MG TOTAL) BY MOUTH 2 (TWO) TIMES DAILY BEFORE A MEAL.     glucose blood test strip  Commonly known as:  ONE TOUCH ULTRA TEST  TEST TWICE A DAY     hydrochlorothiazide 25 MG tablet  Commonly known as:  HYDRODIURIL  TAKE 1 TABLET BY MOUTH DAILY     Insulin Detemir 100 UNIT/ML Pen  Commonly known as:  LEVEMIR FLEXTOUCH  60 U SQ qhs     Insulin Glargine 300 UNIT/ML Sopn  Commonly known as:  TOUJEO SOLOSTAR  Inject 60 Units into the skin daily.     insulin lispro 100 UNIT/ML KiwkPen  Commonly known as:  HUMALOG  10 U qAC     metoprolol 50 MG tablet  Commonly known as:  LOPRESSOR  TAKE 1 AND 1/2 TABLETS BY MOUTH TWICE A DAY     multivitamin capsule  Take 1 capsule by mouth daily.     pantoprazole 40 MG tablet  Commonly known as:  PROTONIX  Take 1 tablet (40 mg total) by mouth 2 (two) times daily.     predniSONE 20 MG tablet  Commonly known as:  DELTASONE  2 tabs po qd x 5d     ramipril 10 MG capsule  Commonly known as:  ALTACE  TAKE 1 CAPSULE (10 MG TOTAL) BY MOUTH DAILY.     sildenafil 20 MG tablet  Commonly known as:  REVATIO  Use 2 tablets at a time as directed     TOBRADEX ophthalmic ointment  Generic drug:  tobramycin-dexamethasone  APPLY A  SMALL AMOUNT INTO  BOTH EYES EVERY NIGHT     vitamin B-12 100 MCG tablet  Commonly known as:  CYANOCOBALAMIN  Take 50 mcg by mouth daily.        Allergies:  Allergies  Allergen Reactions  . Amiodarone Other (See Comments)    "Made me Tommy Morse and Tommy Morse."  . Amoxicillin-Pot Clavulanate Rash  . Statins Other (See Comments)    "Puts me in a funk."    Past Medical History  Diagnosis Date  . Hypertension   . Hyperlipemia, mixed     intolerance to statins  . LVH (left ventricular hypertrophy)   . Hypertriglyceridemia     Triglycerides 741 March '13 - Initiated on Gemfibrozil  . Diabetes mellitus     A1C 7.2 Aug '13, microalbumin slightly elevated  . Coronary artery disease     CABG - x6 - 2008, - Lexiscan myoview 05/14/11 no inducible ischemia, EF 68%  . 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 Left sided sciatica 2012    neuropathic meds no help/side effects.  Has seen multiple specialists.  EMG neg.  Dr. 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  . Left hip pain 2012    Dr. Ramos--injections no help---long record of specialists ([facility] did CT lumbar myelogram and EMG/NCS.  Neurology did MRI L spine), still no final explanation and spinal stimulator may be ultimate treatment  . BPH (benign prostatic hypertrophy)   . History of balanitis     pt uncircumcised  . GERD (gastroesophageal reflux disease)   . History of adenomatous polyp of colon 03/2013    Recall 5 yrs  . Hypogonadism male     Testost implants --testopel-- 05/2013 (Dr. Ivory Broad [phone])  . Chronic renal insufficiency, stage III (moderate) 11/20/2013    CrCl 50s    Past Surgical History  Procedure Laterality Date  . Cardiac catheterization  04/19/06    EF 60-65%  . 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 Laminectomy  06/2009     L3-L4  . Back surgery  2013  . Prostate surgery      ?TURP--need old records  . Esophagogastroduodenoscopy (egd) with esophageal dilation  01/2013    EGD normal (no stricture) but dilation done for pt's dysphagia  . Colonoscopy w/ polypectomy  03/2013    Tubular adenoma; repeat 5 yrs  . Carotid u/s  12/2013    Stable 1-39% bilateral ICA stenosis.  O/w unremarkable.  Recommended repeat in 2 yrs (12/2015).  . Transthoracic echocardiogram  2008    Mild LVH, normal systolic fxn/normal wall motion, +diastolic dysfxn.    Family History  Problem Relation Age of Onset  . Heart disease Mother     valvular heart disease / Strong Family History  . Heart attack Mother   . Heart attack Father   . Hypertension Brother   . Colon cancer Neg Hx   . Esophageal cancer Neg Hx   . Stomach cancer Neg Hx   . Rectal cancer Neg Hx     Social History:  reports that he has never smoked. He has never used smokeless tobacco. He reports that he does not drink alcohol or use illicit drugs.    Review of Systems       Most recent eye exam was in 2015  Lipids:  Currently taking only Gemfibrozil.  He thinks he has aching of joints and muscles with statin drugs including Livalo Had taken Zetia several years ago  His insurance has denied Repatha and not clear what their criteria are for approval but they do want 12 week treatment with Zetia He was told to restart Zetia in order to get Repatha but he has not done so       Lab Results  Component Value Date   CHOL 344* 11/20/2013   HDL 33.40* 11/20/2013   LDLCALC 133* 12/11/2012   LDLDIRECT 242.6 11/20/2013   TRIG 283.0* 11/20/2013   CHOLHDL 10 11/20/2013                  The blood pressure has been controlled with HCTZ 12.5 mg, ramipril and metoprolol. His HCTZ was reduced to half a tablet reduced polyuria which he has done  Diabetic foot exam in 2/16 shows normal monofilament sensation in the toes of the right foot, decreased on the left mildly  especially the big toe, normal on the  plantar surfaces, no skin lesions, calluses or ulcers on the feet.  Pedal pulses: Right posterior tibialis normal, left posterior tibialis 1-2+ and both  anterior tibialis difficult to palpate  LABS:  Office Visit on 08/26/2014  Component Date Value Ref Range Status  . Microalb, Ur 08/26/2014 <0.7  0.0 - 1.9 mg/dL Final  . Creatinine,U 08/26/2014 129.2   Final  . Microalb Creat Ratio 08/26/2014 0.5  0.0 - 30.0 mg/g Final  . Color, UA 08/26/2014 Yellow   Final  . Clarity, UA 08/26/2014 Clear   Final  . Glucose, UA 08/26/2014 Neg   Final  . Bilirubin, UA 08/26/2014 Neg   Final  . Ketones, UA 08/26/2014 Neg   Final  . Spec Grav, UA 08/26/2014 1.015   Final  . Blood, UA 08/26/2014 Neg   Final  . pH, UA 08/26/2014 6.0   Final  . Protein, UA 08/26/2014 Trace   Final  . Urobilinogen, UA 08/26/2014 0.2   Final  . Nitrite, UA 08/26/2014 Neg   Final  . Leukocytes, UA 08/26/2014 Negative  Negative Final    Physical Examination:  BP 110/62 mmHg  Pulse 57  Temp(Src) 98 F (36.7 C)  Resp 14  Ht 6' (1.829 m)  Wt 202 lb 3.2 oz (91.717 kg)  BMI 27.42 kg/m2  SpO2 97%    ASSESSMENT:  Diabetes type 2, uncontrolled with persistently high A1c despite starting insulin in 2015. See history of present illness for detailed discussion of current blood sugar patterns, management, problems identified   He probably does not have 24-hour control of his basal insulin with Levemir in the morning Also has inadequate mealtime coverage which is also somewhat difficult to assess since he is not checking many readings after meals and his monitor has incorrect time programmed on it Although he has tried to be fairly compliant with diet usually he does have some variability in his blood sugars especially in the afternoons  HYPERTENSION: Well controlled, blood pressure is again low normal, asymptomatic and taking ramipril  HYPERLIPIDEMIA: He has marked increase in LDL  levels over 200 and is intolerant to statins.He has known CAD He is interested in trying to get treatment   PLAN:   Trial of Toujeo, starting with the same dose as Levemir and adjust based on fasting blood sugars as discussed   Increase coverage for breakfast and supper with Humalog and adjust based on meal size  and carbohydrate intake More consistent blood sugar monitoring after various meals by rotation  Label readings when he checks them after meals  Start water exercises  Will start another prior authorization for Repatha with his new insurance  Check A1c  Patient Instructions  Touejo adjust based on am sugars   Check blood sugars on waking up ..daily Also check blood sugars about 2 hours after a meal and do this after different meals by rotation  Recommended blood sugar levels on waking up is 90-130 and about 2 hours after meal is 140-180 Please bring blood sugar monitor to each visit.  Humalog 8 at breakfast Humalog 18-20 at supper to keep sugar in range after supper    Counseling time on subjects discussed above is over 50% of today's 25 minute visit   Estella Malatesta 08/26/2014, 2:44 PM   Note: This office note was prepared with Estate agent. Any transcriptional errors that result from this process are unintentional.

## 2014-08-26 NOTE — Patient Instructions (Signed)
Touejo adjust based on am sugars   Check blood sugars on waking up ..daily Also check blood sugars about 2 hours after a meal and do this after different meals by rotation  Recommended blood sugar levels on waking up is 90-130 and about 2 hours after meal is 140-180 Please bring blood sugar monitor to each visit.  Humalog 8 at breakfast Humalog 18-20 at supper to keep sugar in range after supper

## 2014-08-30 ENCOUNTER — Encounter: Payer: Self-pay | Admitting: Family Medicine

## 2014-08-30 NOTE — Progress Notes (Signed)
Quick Note:  A1c is better at 7.6, cholesterol still very high, kidneys okay ______

## 2014-09-01 ENCOUNTER — Other Ambulatory Visit: Payer: Self-pay | Admitting: Internal Medicine

## 2014-09-13 ENCOUNTER — Other Ambulatory Visit: Payer: Self-pay

## 2014-09-13 MED ORDER — HYDROCHLOROTHIAZIDE 25 MG PO TABS: 25.0000 mg | ORAL_TABLET | Freq: Every day | ORAL | Status: DC

## 2014-09-13 MED ORDER — GEMFIBROZIL 600 MG PO TABS: 600.0000 mg | ORAL_TABLET | Freq: Two times a day (BID) | ORAL | Status: DC

## 2014-09-13 MED ORDER — CLOPIDOGREL BISULFATE 75 MG PO TABS: 75.0000 mg | ORAL_TABLET | Freq: Every day | ORAL | Status: DC

## 2014-09-13 NOTE — Telephone Encounter (Signed)
Patient has an appointment with Dr. Rayann Heman on 09/27/2014. Wants to switch to mail order at that time. Advised him to let staff know this, while he is here.

## 2014-09-17 ENCOUNTER — Telehealth: Payer: Self-pay | Admitting: Family Medicine

## 2014-09-17 NOTE — Telephone Encounter (Signed)
Pls notify pt I have not been able to review his chart to make sure the that the request is ok.  I'll get to it as soon as I can.-thx

## 2014-09-17 NOTE — Telephone Encounter (Signed)
Pt advised and voiced understanding.   

## 2014-09-17 NOTE — Telephone Encounter (Signed)
Pt is requesting an MRI w/ contrast on his hip at [place].

## 2014-09-20 ENCOUNTER — Other Ambulatory Visit: Payer: Self-pay | Admitting: Family Medicine

## 2014-09-20 ENCOUNTER — Other Ambulatory Visit: Payer: Self-pay

## 2014-09-20 ENCOUNTER — Telehealth: Payer: Self-pay | Admitting: Family Medicine

## 2014-09-20 DIAGNOSIS — M79602 Pain in left arm: Principal | ICD-10-CM

## 2014-09-20 DIAGNOSIS — M79605 Pain in left leg: Principal | ICD-10-CM

## 2014-09-20 DIAGNOSIS — G8929 Other chronic pain: Secondary | ICD-10-CM

## 2014-09-20 DIAGNOSIS — M25552 Pain in left hip: Principal | ICD-10-CM

## 2014-09-20 NOTE — Telephone Encounter (Signed)
This message was taken care of and a NEW phone note was done in the place of this one.

## 2014-09-20 NOTE — Telephone Encounter (Signed)
Pls call pt and let him know I have ordered the MRI per his request.-thx

## 2014-09-20 NOTE — Telephone Encounter (Signed)
Left message for pt to call back  °

## 2014-09-27 ENCOUNTER — Other Ambulatory Visit: Payer: Self-pay

## 2014-09-27 ENCOUNTER — Encounter: Payer: Self-pay | Admitting: Internal Medicine

## 2014-09-27 ENCOUNTER — Ambulatory Visit (INDEPENDENT_AMBULATORY_CARE_PROVIDER_SITE_OTHER): Payer: Medicare Other | Admitting: Internal Medicine

## 2014-09-27 VITALS — BP 108/60 | HR 61 | Ht 73.0 in | Wt 206.0 lb

## 2014-09-27 DIAGNOSIS — I25118 Atherosclerotic heart disease of native coronary artery with other forms of angina pectoris: Secondary | ICD-10-CM

## 2014-09-27 DIAGNOSIS — G464 Cerebellar stroke syndrome: Secondary | ICD-10-CM | POA: Diagnosis not present

## 2014-09-27 DIAGNOSIS — I10 Essential (primary) hypertension: Secondary | ICD-10-CM | POA: Diagnosis not present

## 2014-09-27 DIAGNOSIS — R0789 Other chest pain: Secondary | ICD-10-CM | POA: Diagnosis not present

## 2014-09-27 DIAGNOSIS — G463 Brain stem stroke syndrome: Secondary | ICD-10-CM

## 2014-09-27 MED ORDER — NITROGLYCERIN 0.4 MG SL SUBL: 0.4000 mg | SUBLINGUAL_TABLET | SUBLINGUAL | Status: AC | PRN

## 2014-09-27 NOTE — Telephone Encounter (Signed)
Pt advised and voiced understanding.   

## 2014-09-27 NOTE — Patient Instructions (Signed)
Medication Instructions:  Your physician recommends that you continue on your current medications as directed. Please refer to the Current Medication list given to you today.   Labwork: None ordered  Testing/Procedures: Your physician has requested that you have en exercise stress myoview. For further information please visit [email or web address]. Please follow instruction sheet, as given.  Your physician has requested that you have an echocardiogram. Echocardiography is a painless test that uses sound waves to create images of your heart. It provides your doctor with information about the size and shape of your heart and how well your heart's chambers and valves are working. This procedure takes approximately one hour. There are no restrictions for this procedure.      Follow-Up:  You have been referred to Tommy Morse, Pharm D for lipid management  Your physician wants you to follow-up in: 6 months with Tommy Morse will receive a reminder letter in the mail two months in advance. If you don't receive a letter, please call our office to schedule the follow-up appointment.   Any Other Special Instructions Will Be Listed Below (If Applicable).

## 2014-09-27 NOTE — Progress Notes (Signed)
PCP:  [name], MD  The patient presents today for routine cardiology followup.  Since his last visit to our office, he reports doing very well.  His primary limitation remains sciatica/ leg pain.  This pain is quite bothersome to him.  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  He has not been able to exercise much.  He has begun to notice substernal chest discomfort. He also has occasional SOB.   Diabetes and HL remain poorly controlled.  He has not tolerated statins.  Past Medical History  Diagnosis Date  . Hypertension   . Hyperlipemia, mixed     intolerance to statins  . LVH (left ventricular hypertrophy)   . Hypertriglyceridemia     Triglycerides 741 March '13 - Initiated on Gemfibrozil  . Type II diabetes mellitus with complication, uncontrolled     A1C 7.2 Aug '13, microalbumin slightly elevated  . Coronary artery disease     CABG - x6 - 2008, - Lexiscan myoview 05/14/11 no inducible ischemia, EF 68%  . 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 Left sided sciatica 2012    neuropathic meds no help/side effects.  Has seen multiple specialists.  EMG neg.  Dr. 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  . Left hip pain 2012    Dr. Ramos--injections no help---long record of specialists ([facility] did CT lumbar myelogram and EMG/NCS.  Neurology did MRI L spine), still no final explanation and spinal stimulator may be ultimate treatment  . BPH (benign prostatic hypertrophy)   . History of balanitis     pt uncircumcised  . GERD (gastroesophageal reflux disease)   . History of adenomatous polyp of colon 03/2013    Recall 5 yrs  . Hypogonadism male     Testost implants --testopel-- 05/2013 (Dr. Ivory Broad [phone])  . Chronic renal insufficiency, stage III (moderate) 11/20/2013     CrCl 50s   Past Surgical History  Procedure Laterality Date  . Cardiac catheterization  04/19/06    EF 60-65%  . 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 Laminectomy  06/2009    L3-L4  . Back surgery  2013  . Prostate surgery      ?TURP--need old records  . Esophagogastroduodenoscopy (egd) with esophageal dilation  01/2013    EGD normal (no stricture) but dilation done for pt's dysphagia  . Colonoscopy w/ polypectomy  03/2013    Tubular adenoma; repeat 5 yrs  . Carotid u/s  12/2013    Stable 1-39% bilateral ICA stenosis.  O/w unremarkable.  Recommended repeat in 2 yrs (12/2015).  . Transthoracic echocardiogram  2008    Mild LVH, normal systolic fxn/normal wall motion, +diastolic dysfxn.    Current Outpatient Prescriptions  Medication Sig Dispense Refill  . BD PEN NEEDLE NANO U/F 32G X 4 MM MISC USE WITH INSULIN PEN (ONE AT BEDTIME AND ONE AT [facility] MEAL) 4 PER DAY  6  . clopidogrel (PLAVIX) 75 MG tablet Take 1 tablet (75 mg total) by mouth daily. 30 tablet 2  . ezetimibe (ZETIA) 10 MG tablet Take 1 tablet (10 mg total) by mouth daily. 30 tablet 3  . gemfibrozil (LOPID) 600 MG tablet Take 1 tablet (600 mg total) by mouth 2 (two) times daily before a meal. 60 tablet 0  . glucose blood (ONE TOUCH  ULTRA TEST) test strip TEST TWICE A DAY 50 each 3  . hydrochlorothiazide (HYDRODIURIL) 25 MG tablet Take 1 tablet (25 mg total) by mouth daily. 30 tablet 0  . Insulin Detemir (LEVEMIR FLEXTOUCH) 100 UNIT/ML Pen 60 U SQ qhs (Patient taking differently: Inject 58 Units into the skin every morning. ) 30 mL 3  . insulin lispro (HUMALOG) 100 UNIT/ML injection Inject 8 units into the skin in the morning, 8 units in afternoon, and 20 units in the evening    . metoprolol (LOPRESSOR) 50 MG tablet TAKE 1 AND 1/2 TABLETS BY MOUTH TWICE A DAY 270 tablet 0  . Multiple Vitamin (MULTIVITAMIN) capsule Take 1 capsule by mouth daily.    Marland Kitchen OXCARBAZEPINE PO Take by mouth as directed. Pt unsure of strength     . pantoprazole (PROTONIX) 40 MG tablet Take 1 tablet (40 mg total) by mouth 2 (two) times daily. 180 tablet 1  . ramipril (ALTACE) 10 MG capsule TAKE 1 CAPSULE (10 MG TOTAL) BY MOUTH DAILY. 30 capsule 1  . vitamin B-12 (CYANOCOBALAMIN) 100 MCG tablet Take 50 mcg by mouth daily.    . nitroGLYCERIN (NITROSTAT) 0.4 MG SL tablet Place 1 tablet (0.4 mg total) under the tongue every 5 (five) minutes as needed for chest pain. 30 tablet 11  . [DISCONTINUED] gabapentin (NEURONTIN) 300 MG capsule 600 mg. increase to 1 tab three times a day as directed     No current facility-administered medications for this visit.    Allergies  Allergen Reactions  . Amiodarone Other (See Comments)    "Made me Dr. Jack Quarto and [name]."  . Amoxicillin-Pot Clavulanate Rash  . Statins Other (See Comments)    "Puts me in a funk."    History   Social History  . Marital Status: Married    Spouse Name: N/A  . Number of Children: N/A  . Years of Education: N/A   Occupational History  . pastor     [employer]   Social History Main Topics  . Smoking status: Never Smoker   . Smokeless tobacco: Never Used  . Alcohol Use: No  . Drug Use: No  . Sexual Activity: Not on file   Other Topics Concern  . Not on file   Social History Narrative   Married, 2 sons and 1 daughter.  Two grandsons.   Orig from [place].  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   No T/A/Ds.    Family History  Problem Relation Age of Onset  . Heart disease Mother     valvular heart disease / Strong Family History  . Heart attack Mother   . Heart attack Father   . Hypertension Brother   . Colon cancer Neg Hx   . Esophageal cancer Neg Hx   . Stomach cancer Neg Hx   . Rectal cancer Neg Hx     Physical Exam: Filed Vitals:   09/27/14 1509  BP: 108/60  Pulse: 61  Height: 6\' 1"  (1.854 m)  Weight: 93.441 kg (206 lb)    GEN- The patient is well appearing, alert and oriented x 3 today.   Head- normocephalic,  atraumatic Eyes-  Sclera clear, conjunctiva pink Ears- hearing intact Oropharynx- clear Neck- supple, no JVP Lymph- no cervical lymphadenopathy Lungs- Clear to ausculation bilaterally, normal work of breathing Heart- Regular rate and rhythm, no murmurs, rubs or gallops, PMI not laterally displaced GI- soft, NT, ND, + BS Extremities- no clubbing, cyanosis, or edema  ekg  today reveals sinus rhythm, inferior infarction, no ischemic changes  Assessment and Plan:  1. Cad Given recent onset of chest pain and SOB, will proceed with myoview Will attempt gxt myoview but may have to convert to [place] if he dose not meet target HR Continue plavix and beta blocker Nitrostat prescribed today Echo is ordered  2. HL Has not tolerated statins the past I will refer back to lipid clinic for consideration of new lipid lowering drug therapy.  He has seen [employer] previously in our clinic  3. HTN Stable, echo is ordered No change required today  4. Prior CVA Carotid dopplers are reviewed No changes at this time  Return to see me in 6 months

## 2014-09-28 ENCOUNTER — Ambulatory Visit
Admission: RE | Admit: 2014-09-28 | Discharge: 2014-09-28 | Disposition: A | Discharge status: Home / Self Care | Payer: Medicare Other | Source: Ambulatory Visit | Attending: Family Medicine | Admitting: Family Medicine

## 2014-09-28 DIAGNOSIS — G8929 Other chronic pain: Secondary | ICD-10-CM

## 2014-09-28 DIAGNOSIS — M79605 Pain in left leg: Principal | ICD-10-CM

## 2014-09-28 DIAGNOSIS — M79602 Pain in left arm: Principal | ICD-10-CM

## 2014-09-28 DIAGNOSIS — M25552 Pain in left hip: Secondary | ICD-10-CM

## 2014-09-28 MED ORDER — GADOBENATE DIMEGLUMINE 529 MG/ML IV SOLN
19.0000 mL | Freq: Once | INTRAVENOUS | Status: AC | PRN
  Administered 2014-09-28: 19 mL via INTRAVENOUS

## 2014-10-04 ENCOUNTER — Ambulatory Visit (INDEPENDENT_AMBULATORY_CARE_PROVIDER_SITE_OTHER): Payer: Medicare Other | Admitting: Pharmacist

## 2014-10-04 ENCOUNTER — Telehealth (HOSPITAL_COMMUNITY): Payer: Self-pay

## 2014-10-04 DIAGNOSIS — G464 Cerebellar stroke syndrome: Secondary | ICD-10-CM | POA: Diagnosis not present

## 2014-10-04 DIAGNOSIS — E785 Hyperlipidemia, unspecified: Secondary | ICD-10-CM

## 2014-10-04 NOTE — Progress Notes (Signed)
Patient ID: Tommy Morse          DOB: 05/16/2050, 64 yo                     MRN: [id number]     HPI: Tommy Morse is a 64 y.o. male patient referred to lipid clinic by Dr. Rayann Heman given his history of elevated LDL, TG, and statin intolerance. PMH is significant for CABG x6 in 2008, CVA in 2012, HTN, HLD, and DM2. He currently only takes gemfibrozil for his cholesterol, which was started in March 2013 due to TG of 700mg /dL while in the hospital. He took Tommy Morse for years but stopped it as he didn't feel it was working well, and got tired of taking so many pills. He also has intolerances to 5 statins, Zetia, and fenofibrate.  Current Medications: gemfibrozil 600mg  BID Intolerances: Lipitor, Zocor, Crestor, Pravachol, Livalo, Zetia, fenofibrate - doses and durations unknown. Patient reports fatigue, anxiety, joint pain, and myalgias.  Risk Factors: ASCVD (CABG x6, CVA), HLD, sex, age, family history LDL goal: 70mg /dL for secondary prevention  Diet: Eats most meals at home, his wife does most of the cooking. DM-healthy diet. Breakfast - coffee, yogurt, banana muffin. Lunch - tuna sandwich. Dinner - baked chicken, mashed potatoes, beans, or fish.   Exercise: Minimal. Exercise is limited by sciatica and leg pain which bothers him on a daily basis.  Family History: Mother died at 32 due to heart disease. Father died at 51 with heard disease (hx of CABG and MI)  Social History: No alcohol or tobacco use. Is a minister  Labs: 08/26/14: TC 305, TG 222, HDL 36.6, LDL-D 211, LFTs wnl (on gemfibrozil; A1c 7.6) 11/20/13: TC 344, TG 283, HDL 33.4, LDL-D 242.6, LFTs wnl (on gemfibrozil; A1c 10.8)  Current Outpatient Prescriptions on File Prior to Visit  Medication Sig Dispense Refill  . BD PEN NEEDLE NANO U/F 32G X 4 MM MISC USE WITH INSULIN PEN (ONE AT BEDTIME AND ONE AT [facility] MEAL) 4 PER DAY  6  . clopidogrel (PLAVIX) 75 MG tablet Take 1 tablet (75 mg total) by mouth daily. 30 tablet 2  . gemfibrozil  (LOPID) 600 MG tablet Take 1 tablet (600 mg total) by mouth 2 (two) times daily before a meal. 60 tablet 0  . glucose blood (ONE TOUCH ULTRA TEST) test strip TEST TWICE A DAY 50 each 3  . hydrochlorothiazide (HYDRODIURIL) 25 MG tablet Take 1 tablet (25 mg total) by mouth daily. 30 tablet 0  . Insulin Detemir (LEVEMIR FLEXTOUCH) 100 UNIT/ML Pen 60 U SQ qhs (Patient taking differently: Inject 58 Units into the skin every morning. ) 30 mL 3  . insulin lispro (HUMALOG) 100 UNIT/ML injection Inject 8 units into the skin in the morning, 8 units in afternoon, and 20 units in the evening    . metoprolol (LOPRESSOR) 50 MG tablet TAKE 1 AND 1/2 TABLETS BY MOUTH TWICE A DAY 270 tablet 0  . Multiple Vitamin (MULTIVITAMIN) capsule Take 1 capsule by mouth daily.    . nitroGLYCERIN (NITROSTAT) 0.4 MG SL tablet Place 1 tablet (0.4 mg total) under the tongue every 5 (five) minutes as needed for chest pain. 30 tablet 11  . pantoprazole (PROTONIX) 40 MG tablet Take 1 tablet (40 mg total) by mouth 2 (two) times daily. 180 tablet 1  . ramipril (ALTACE) 10 MG capsule TAKE 1 CAPSULE (10 MG TOTAL) BY MOUTH DAILY. 30 capsule 1  . vitamin B-12 (CYANOCOBALAMIN) 100 MCG  tablet Take 50 mcg by mouth daily.    Marland Kitchen ezetimibe (ZETIA) 10 MG tablet Take 1 tablet (10 mg total) by mouth daily. (Patient not taking: Reported on 10/04/2014) 30 tablet 3  . OXCARBAZEPINE PO Take by mouth as directed. Pt unsure of strength    . [DISCONTINUED] gabapentin (NEURONTIN) 300 MG capsule 600 mg. increase to 1 tab three times a day as directed     No current facility-administered medications on file prior to visit.   Allergies  Allergen Reactions  . Fenofibrate Other (See Comments)    Myalgias   . Amiodarone Other (See Comments)    "Made me Dr. Jack Quarto and [name]."  . Amoxicillin-Pot Clavulanate Rash  . Statins Other (See Comments)    "Puts me in a funk."     Assessment/Plan: 1. Hyperlipidemia - Patient with LDL >200mg /dL on gemfibrozil  with statin intolerance to 5 different statins + Zetia and fenofibrate. LDL goal 70mg /dL given patient's history of ASCVD (CABG and CVA) and strong cardiac family history. Patient at high risk for future cardiac event and needs potent LDL lowering - due to statin intolerance, patient is a good candidate for PCSK9i therapy. Will begin paperwork for Praluent approval.   Megan E. Supple, PharmD [facility] [address] [place], [place] [address] Phone: [phone]; Fax: [phone] 10/04/2014 4:41 PM

## 2014-10-04 NOTE — Telephone Encounter (Signed)
Patient given detailed instructions per Myocardial Perfusion Study Information Sheet for test on 10-07-2014 at 0900. Patient Notified to arrive at [facility] for Echo, and that it is imperative to arrive on time for appointment to keep from having the test rescheduled. Patient verbalized understanding. Oletta Lamas, Shaily Librizzi A

## 2014-10-05 ENCOUNTER — Other Ambulatory Visit: Payer: Self-pay | Admitting: Pharmacist

## 2014-10-05 DIAGNOSIS — E785 Hyperlipidemia, unspecified: Secondary | ICD-10-CM

## 2014-10-05 MED ORDER — ALIROCUMAB 75 MG/ML ~~LOC~~ SOPN: 75.0000 mg | PEN_INJECTOR | SUBCUTANEOUS | Status: DC

## 2014-10-07 ENCOUNTER — Ambulatory Visit (HOSPITAL_COMMUNITY): Payer: Medicare Other | Attending: Internal Medicine

## 2014-10-07 ENCOUNTER — Ambulatory Visit (HOSPITAL_BASED_OUTPATIENT_CLINIC_OR_DEPARTMENT_OTHER): Payer: Medicare Other

## 2014-10-07 ENCOUNTER — Other Ambulatory Visit: Payer: Self-pay

## 2014-10-07 DIAGNOSIS — I371 Nonrheumatic pulmonary valve insufficiency: Secondary | ICD-10-CM | POA: Insufficient documentation

## 2014-10-07 DIAGNOSIS — I340 Nonrheumatic mitral (valve) insufficiency: Secondary | ICD-10-CM | POA: Insufficient documentation

## 2014-10-07 DIAGNOSIS — R9439 Abnormal result of other cardiovascular function study: Secondary | ICD-10-CM | POA: Diagnosis not present

## 2014-10-07 DIAGNOSIS — R0789 Other chest pain: Secondary | ICD-10-CM | POA: Diagnosis not present

## 2014-10-07 DIAGNOSIS — I517 Cardiomegaly: Secondary | ICD-10-CM | POA: Insufficient documentation

## 2014-10-07 DIAGNOSIS — R079 Chest pain, unspecified: Secondary | ICD-10-CM | POA: Diagnosis present

## 2014-10-07 DIAGNOSIS — I071 Rheumatic tricuspid insufficiency: Secondary | ICD-10-CM | POA: Insufficient documentation

## 2014-10-07 LAB — MYOCARDIAL PERFUSION IMAGING
CHL CUP MPHR: 127 {beats}/min
CHL CUP NUCLEAR SDS: 9
CHL RATE OF PERCEIVED EXERTION: 11
CSEPEW: 6.1 METS
CSEPHR: 82 %
Exercise duration (min): 4 min
Exercise duration (sec): 15 s
LV sys vol: 47 mL
LVDIAVOL: 89 mL
Peak HR: 127 {beats}/min
RATE: 0.34
Rest HR: 69 {beats}/min
SRS: 17
SSS: 26
TID: 0.95

## 2014-10-07 MED ORDER — TECHNETIUM TC 99M SESTAMIBI GENERIC - CARDIOLITE
31.3000 | Freq: Once | INTRAVENOUS | Status: AC | PRN
  Administered 2014-10-07: 31.3 via INTRAVENOUS

## 2014-10-07 MED ORDER — REGADENOSON 0.4 MG/5ML IV SOLN
0.4000 mg | Freq: Once | INTRAVENOUS | Status: AC
  Administered 2014-10-07: 0.4 mg via INTRAVENOUS

## 2014-10-07 MED ORDER — TECHNETIUM TC 99M SESTAMIBI GENERIC - CARDIOLITE
9.6000 | Freq: Once | INTRAVENOUS | Status: AC | PRN
  Administered 2014-10-07: 10 via INTRAVENOUS

## 2014-10-11 ENCOUNTER — Other Ambulatory Visit: Payer: Self-pay | Admitting: Internal Medicine

## 2014-10-12 ENCOUNTER — Ambulatory Visit: Payer: 59 | Admitting: Endocrinology

## 2014-10-13 ENCOUNTER — Other Ambulatory Visit: Payer: Self-pay | Admitting: *Deleted

## 2014-10-13 DIAGNOSIS — K219 Gastro-esophageal reflux disease without esophagitis: Secondary | ICD-10-CM

## 2014-10-13 NOTE — Telephone Encounter (Signed)
LOV: 03/23/14  NOV: None  RF request for Humalog Last written: Unknown  RF request for pantoprazole Last written: 03/26/14 #180 w/ 1RF  Please advise. Thanks.

## 2014-10-14 MED ORDER — PANTOPRAZOLE SODIUM 40 MG PO TBEC: 40.0000 mg | DELAYED_RELEASE_TABLET | Freq: Two times a day (BID) | ORAL | Status: DC

## 2014-10-18 ENCOUNTER — Telehealth: Payer: Self-pay | Admitting: Family Medicine

## 2014-10-18 ENCOUNTER — Other Ambulatory Visit: Payer: Self-pay | Admitting: *Deleted

## 2014-10-18 ENCOUNTER — Other Ambulatory Visit: Payer: Self-pay

## 2014-10-18 DIAGNOSIS — K219 Gastro-esophageal reflux disease without esophagitis: Secondary | ICD-10-CM

## 2014-10-18 MED ORDER — METOPROLOL TARTRATE 50 MG PO TABS: ORAL_TABLET | ORAL | Status: AC

## 2014-10-18 MED ORDER — GEMFIBROZIL 600 MG PO TABS: ORAL_TABLET | ORAL | Status: AC

## 2014-10-18 MED ORDER — PANTOPRAZOLE SODIUM 40 MG PO TBEC: 40.0000 mg | DELAYED_RELEASE_TABLET | Freq: Two times a day (BID) | ORAL | Status: AC

## 2014-10-18 MED ORDER — CLOPIDOGREL BISULFATE 75 MG PO TABS
75.0000 mg | ORAL_TABLET | Freq: Every day | ORAL | Status: AC
Start: 2014-10-18 — End: ?

## 2014-10-18 NOTE — Telephone Encounter (Signed)
Tommy Grayer, MD at 09/27/2014 10:01 PM   gemfibrozil (LOPID) 600 MG tablet Take 1 tablet (600 mg total) by mouth 2 (two) times daily before a meal         clopidogrel (PLAVIX) 75 MG tablet Take 1 tablet (75 mg total) by mouth daily         metoprolol (LOPRESSOR) 50 MG tablet TAKE 1 AND 1/2 TABLETS BY MOUTH TWICE A DAY         Medication Instructions:  Your physician recommends that you continue on your current medications as directed. Please refer to the Current Medication list given to you today.

## 2014-10-18 NOTE — Telephone Encounter (Signed)
Spoke to pt about refill for pantoprazole, Rx sent to [facility] per pt request.

## 2014-10-18 NOTE — Telephone Encounter (Signed)
Pt returned [place] phone call. Please call him back.

## 2014-10-18 NOTE — Telephone Encounter (Signed)
RF request for pantoprazole LOV: 05/11/14 Next ov: None Last written: 10/14/14 sent to local.

## 2014-10-19 ENCOUNTER — Other Ambulatory Visit: Payer: Self-pay | Admitting: *Deleted

## 2014-10-19 MED ORDER — RAMIPRIL 10 MG PO CAPS: ORAL_CAPSULE | ORAL | Status: DC

## 2014-10-19 MED ORDER — INSULIN LISPRO 100 UNIT/ML (KWIKPEN): PEN_INJECTOR | SUBCUTANEOUS | Status: AC

## 2014-10-19 MED ORDER — HYDROCHLOROTHIAZIDE 25 MG PO TABS: 25.0000 mg | ORAL_TABLET | Freq: Every day | ORAL | Status: DC

## 2014-10-20 ENCOUNTER — Other Ambulatory Visit: Payer: Self-pay

## 2014-10-20 MED ORDER — RAMIPRIL 10 MG PO CAPS: ORAL_CAPSULE | ORAL | Status: AC

## 2014-10-20 MED ORDER — HYDROCHLOROTHIAZIDE 25 MG PO TABS: 25.0000 mg | ORAL_TABLET | Freq: Every day | ORAL | Status: AC

## 2014-10-21 ENCOUNTER — Other Ambulatory Visit: Payer: Self-pay | Admitting: *Deleted

## 2014-10-30 ENCOUNTER — Other Ambulatory Visit: Payer: Self-pay | Admitting: Internal Medicine

## 2014-10-31 ENCOUNTER — Other Ambulatory Visit: Payer: Self-pay | Admitting: Internal Medicine

## 2014-11-04 ENCOUNTER — Telehealth: Payer: Self-pay | Admitting: Pharmacist

## 2014-11-04 NOTE — Telephone Encounter (Signed)
Called patient re: Praluent injections. Praluent was covered without PA for 3 months due to 3 month grace period after patient joined Medicare. Per patient, monthly copay is ~$400 and he does not want Praluent injections. He has previously failed Lipitor, Zocor, Crestor, Pravachol, Livalo, Zetia, and fenofibrate (fatigue, anxiety, joint pain, and myalgias), and is currently taking gemfibrozil BID. LDL is 211, wel labove goal <70. However, there are no options left for patient to lower cholesterol since he has failed 5 statins and Zetia and PCSK9i are too expensive.

## 2014-11-18 ENCOUNTER — Encounter: Payer: Self-pay | Admitting: Family Medicine

## 2014-11-18 DIAGNOSIS — Z125 Encounter for screening for malignant neoplasm of prostate: Secondary | ICD-10-CM | POA: Insufficient documentation

## 2014-11-25 ENCOUNTER — Ambulatory Visit (INDEPENDENT_AMBULATORY_CARE_PROVIDER_SITE_OTHER): Payer: Medicare Other | Admitting: Family Medicine

## 2014-11-25 ENCOUNTER — Encounter: Payer: Self-pay | Admitting: Family Medicine

## 2014-11-25 VITALS — BP 110/71 | HR 66 | Temp 98.2°F | Resp 16 | Ht 73.0 in | Wt 206.0 lb

## 2014-11-25 DIAGNOSIS — B349 Viral infection, unspecified: Secondary | ICD-10-CM

## 2014-11-25 DIAGNOSIS — G464 Cerebellar stroke syndrome: Secondary | ICD-10-CM | POA: Diagnosis not present

## 2014-11-25 DIAGNOSIS — B001 Herpesviral vesicular dermatitis: Secondary | ICD-10-CM | POA: Diagnosis not present

## 2014-11-25 MED ORDER — VALACYCLOVIR HCL 1 G PO TABS: ORAL_TABLET | ORAL | Status: AC

## 2014-11-25 NOTE — Progress Notes (Signed)
OFFICE NOTE  11/25/2014  CC:  Chief Complaint  Patient presents with  . Headache  . Fatigue  . Mouth Lesions    HPI: Patient is a 64 y.o. Caucasian male who is here for about 4d of feeling fatigued, mild HA, has a cold sore, some watery eyes.  A little runny nose L side, slight cough when he lies supine hs.  No sT. No signif abd pains, no n/v/d.  Appetite is fine.  Taking tramadol recently for chronic sciatica pain--[facility]  He'll be getting DRG therapy with them soon.  Pertinent PMH:  Past medical, surgical, social, and family history reviewed and no changes are noted since last office visit.  MEDS:  Outpatient Prescriptions Prior to Visit  Medication Sig Dispense Refill  . BD PEN NEEDLE NANO U/F 32G X 4 MM MISC USE WITH INSULIN PEN (ONE AT BEDTIME AND ONE AT [facility] MEAL) 4 PER DAY  6  . clopidogrel (PLAVIX) 75 MG tablet Take 1 tablet (75 mg total) by mouth daily. 90 tablet 3  . gemfibrozil (LOPID) 600 MG tablet TAKE 1 TABLET (600 MG TOTAL) BY MOUTH 2 (TWO) TIMES DAILY BEFORE A MEAL. 180 tablet 3  . glucose blood (ONE TOUCH ULTRA TEST) test strip TEST TWICE A DAY 50 each 3  . hydrochlorothiazide (HYDRODIURIL) 25 MG tablet Take 1 tablet (25 mg total) by mouth daily. 90 tablet 2  . Insulin Detemir (LEVEMIR FLEXTOUCH) 100 UNIT/ML Pen 60 U SQ qhs (Patient taking differently: Inject 58 Units into the skin every morning. ) 30 mL 3  . insulin lispro (HUMALOG) 100 UNIT/ML KiwkPen Inject 8 units at breakfast and lunch and 20 units at dinner 45 mL 0  . metoprolol (LOPRESSOR) 50 MG tablet TAKE 1 AND 1/2 TABLETS BY MOUTH TWICE A DAY 135 tablet 3  . Multiple Vitamin (MULTIVITAMIN) capsule Take 1 capsule by mouth daily.    . nitroGLYCERIN (NITROSTAT) 0.4 MG SL tablet Place 1 tablet (0.4 mg total) under the tongue every 5 (five) minutes as needed for chest pain. 30 tablet 11  . pantoprazole (PROTONIX) 40 MG tablet Take 1 tablet (40 mg total) by mouth 2  (two) times daily. 180 tablet 3  . ramipril (ALTACE) 10 MG capsule TAKE 1 CAPSULE (10 MG TOTAL) BY MOUTH DAILY. 90 capsule 2  . vitamin B-12 (CYANOCOBALAMIN) 100 MCG tablet Take 50 mcg by mouth daily.    . Alirocumab (PRALUENT) 75 MG/ML SOPN Inject 75 mg into the skin every 14 (fourteen) days. (Patient not taking: Reported on 11/25/2014) 2 pen 11  . ezetimibe (ZETIA) 10 MG tablet Take 1 tablet (10 mg total) by mouth daily. (Patient not taking: Reported on 10/04/2014) 30 tablet 3  . hydrochlorothiazide (HYDRODIURIL) 25 MG tablet TAKE 1 TABLET (25 MG TOTAL) BY MOUTH DAILY. (Patient not taking: Reported on 11/25/2014) 30 tablet 5  . OXCARBAZEPINE PO Take by mouth as directed. Pt unsure of strength     No facility-administered medications prior to visit.    PE: Blood pressure 110/71, pulse 66, temperature 98.2 F (36.8 C), temperature source Oral, resp. rate 16, height 6\' 1"  (1.854 m), weight 206 lb (93.441 kg), SpO2 97 %. VS: noted--normal. Gen: alert, NAD, NONTOXIC APPEARING. HEENT: eyes without injection, drainage, or swelling.  Ears: EACs clear, TMs with normal light reflex and landmarks.  Nose: Clear rhinorrhea, with some dried, crusty exudate adherent to mildly injected mucosa.  No purulent d/c.  No paranasal sinus TTP.  No facial swelling.  Throat and mouth without focal lesion.  No pharyngial swelling, erythema, or exudate.  Lower lip with crusted superficial ulceration about 3-4 mm in size. Neck: supple, no LAD.   LUNGS: CTA bilat, nonlabored resps.   CV: RRR, no m/r/g. EXT: no c/c/e SKIN: no rash    IMPRESSION AND PLAN:  Nonspecific viral syndrome, slight URI focus. No red flags to signal need for any diagnostics today. Symptomatic care discussed, rest, push fluids. Currently has cold sore, has hx of recurrent cold sores. I did rx valtrex 1g tabs, 2 tabs po q12h x 2 doses to use when he gets cold sores.  An After Visit Summary was printed and given to the patient.  FOLLOW UP:  prn

## 2014-11-25 NOTE — Progress Notes (Signed)
Pre visit review using our clinic review tool, if applicable. No additional management support is needed unless otherwise documented below in the visit note. 

## 2014-12-10 ENCOUNTER — Ambulatory Visit (INDEPENDENT_AMBULATORY_CARE_PROVIDER_SITE_OTHER): Payer: Medicare Other

## 2014-12-10 DIAGNOSIS — Z23 Encounter for immunization: Secondary | ICD-10-CM | POA: Diagnosis not present

## 2014-12-28 HISTORY — PX: SPINAL CORD STIMULATOR TRIAL: SHX5380

## 2015-01-24 ENCOUNTER — Telehealth: Payer: Self-pay | Admitting: Internal Medicine

## 2015-01-24 NOTE — Telephone Encounter (Signed)
New message      Request for surgical clearance:  What type of surgery is being performed?  DRG trial When is this surgery scheduled? 01-28-15 Are there any medications that need to be held prior to surgery and how long?  Hold plavix?----(pt already stopped his plavix---not sure when he had his last dosage) 1. Name of physician performing surgery? Dr Trixie Rude 2. What is your office phone and fax number?  Fax [phone]-----need something in writing stating ok to have procedure and stop plavix

## 2015-01-24 NOTE — Telephone Encounter (Signed)
Hold plavix and resume with able post operatively  Proceed with surgery if medically necessary without further CV testing.

## 2015-01-25 ENCOUNTER — Other Ambulatory Visit: Payer: Self-pay | Admitting: Endocrinology

## 2015-01-26 ENCOUNTER — Encounter: Payer: Self-pay | Admitting: Family Medicine

## 2015-01-30 ENCOUNTER — Encounter (HOSPITAL_COMMUNITY): Payer: Self-pay | Admitting: *Deleted

## 2015-01-30 ENCOUNTER — Emergency Department (HOSPITAL_COMMUNITY)
Admission: EM | Admit: 2015-01-30 | Discharge: 2015-02-27 | Disposition: E | Payer: Medicare Other | Attending: Emergency Medicine | Admitting: Emergency Medicine

## 2015-01-30 DIAGNOSIS — Z8673 Personal history of transient ischemic attack (TIA), and cerebral infarction without residual deficits: Secondary | ICD-10-CM | POA: Insufficient documentation

## 2015-01-30 DIAGNOSIS — I2510 Atherosclerotic heart disease of native coronary artery without angina pectoris: Secondary | ICD-10-CM | POA: Diagnosis not present

## 2015-01-30 DIAGNOSIS — Z79899 Other long term (current) drug therapy: Secondary | ICD-10-CM | POA: Insufficient documentation

## 2015-01-30 DIAGNOSIS — E119 Type 2 diabetes mellitus without complications: Secondary | ICD-10-CM | POA: Diagnosis not present

## 2015-01-30 DIAGNOSIS — N183 Chronic kidney disease, stage 3 (moderate): Secondary | ICD-10-CM | POA: Diagnosis not present

## 2015-01-30 DIAGNOSIS — Z7902 Long term (current) use of antithrombotics/antiplatelets: Secondary | ICD-10-CM | POA: Insufficient documentation

## 2015-01-30 DIAGNOSIS — I469 Cardiac arrest, cause unspecified: Secondary | ICD-10-CM | POA: Insufficient documentation

## 2015-01-30 DIAGNOSIS — Z9889 Other specified postprocedural states: Secondary | ICD-10-CM | POA: Insufficient documentation

## 2015-01-30 DIAGNOSIS — Z8739 Personal history of other diseases of the musculoskeletal system and connective tissue: Secondary | ICD-10-CM | POA: Insufficient documentation

## 2015-01-30 DIAGNOSIS — Z86010 Personal history of colonic polyps: Secondary | ICD-10-CM | POA: Diagnosis not present

## 2015-01-30 DIAGNOSIS — Z87438 Personal history of other diseases of male genital organs: Secondary | ICD-10-CM | POA: Diagnosis not present

## 2015-01-30 DIAGNOSIS — Z880 Allergy status to penicillin: Secondary | ICD-10-CM | POA: Diagnosis not present

## 2015-01-30 DIAGNOSIS — Z951 Presence of aortocoronary bypass graft: Secondary | ICD-10-CM | POA: Diagnosis not present

## 2015-01-30 DIAGNOSIS — I129 Hypertensive chronic kidney disease with stage 1 through stage 4 chronic kidney disease, or unspecified chronic kidney disease: Secondary | ICD-10-CM | POA: Diagnosis not present

## 2015-01-30 DIAGNOSIS — Z794 Long term (current) use of insulin: Secondary | ICD-10-CM | POA: Diagnosis not present

## 2015-02-16 ENCOUNTER — Other Ambulatory Visit: Payer: Self-pay | Admitting: Endocrinology

## 2015-02-27 NOTE — ED Notes (Signed)
Pt.'s spouse : Mats Heaster  Tel. No. [phone]

## 2015-02-27 NOTE — ED Notes (Signed)
Post mortem care provided by nurse and EMT . No valuables , pt. clothes and shoes with pt.
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
## 2015-02-27 NOTE — ED Notes (Signed)
[place] donor service notified eye prep request only.  Hold body for possible  Eye donor.  Number [id number]  [facility] shore co-ordinator

## 2015-02-27 DEATH — deceased

## 2015-03-28 ENCOUNTER — Ambulatory Visit: Payer: Medicare Other | Admitting: Internal Medicine
